# Patient Record
Sex: Female | Born: 1950 | Race: Black or African American | Hispanic: No | Marital: Married | Smoking: Never smoker
Health system: Southern US, Community
[De-identification: ages and names within clinical notes are randomized; demographics above are authoritative.]

---

## 2009-01-16 ENCOUNTER — Ambulatory Visit: Payer: Self-pay | Admitting: Vascular Surgery

## 2010-06-23 NOTE — Consult Note (Signed)
VASCULAR SURGERY CONSULTATION   Jackie Duffy, Jackie Duffy  DOB:  [birth year]                                       01/16/2009  ZOXWR#:[id number]   I saw the patient in the office today in consultation concerning her  pain in her left leg and swelling in the left leg.  This is a pleasant  60 year old woman who approximately 2 years ago first began noticing  some knots in her left leg and was felt to have superficial  thrombophlebitis.  These really had not caused much problem until about  4 months ago when she noted some swelling in the left leg and then over  the last 3 months she has had increasing tenderness along the medial  aspect of the left leg and also she noticed some discoloration and  induration along the medial aspect of the left leg.  She experiences  pain in the leg which is aggravated by standing and relieved somewhat  with elevation.  She also notes that she bought herself some mild  compression stockings at the drug store and she feels that this has  helped somewhat when she is wearing her compression stockings.  She has  had no history of DVT that she is aware of.  She denies any  claudication, rest pain or nonhealing ulcers.   PAST MEDICAL HISTORY:  Fairly unremarkable.  She denies any history of  diabetes, hypertension, hypercholesterolemia, history of previous  myocardial infarction, history of congestive heart failure or history of  COPD.   FAMILY HISTORY:  Father had heart attack at age 38.  She is unaware of  any other history of premature cardiovascular disease.   SOCIAL HISTORY:  She is married.  She has two children.  She does not  use tobacco.  She does not use alcohol on a regular basis.   MEDICATIONS:  Are calcium and lysine OTC.   REVIEW OF SYSTEMS:  NEUROLOGIC:  She has occasional headaches and has a  history of migraine headaches.  She has had no dizziness, blackouts or  seizures.  MUSCULOSKELETAL:  She does have some generalized  arthritis.  She had no  significant muscle pain.  GENERAL, CARDIOVASCULAR, PULMONARY, GI, GU, PSYCHIATRIC, ENT,  HEMATOLOGIC review of systems is unremarkable and is documented on the  medical history form in her chart.   PHYSICAL EXAMINATION:  This is a pleasant 60 year old woman who appears  her stated age.  Her blood pressure is 152/79, heart rate is 87,  temperature is 97.8.  HEENT:  Pupils equal, round, and reactive to light  and accommodation.  Extraocular motions are intact.  Conjunctivae are  normal.  Neck is supple.  There is no JVD.  She does have an enlarged  thyroid.  No other masses are appreciated.  On cardiovascular exam I do  not detect any carotid bruits.  She has a regular rate and rhythm  without murmur or gallop appreciated.  She has minimal swelling in the  left leg with no swelling in the right leg.  She has palpable radial,  femoral, popliteal and posterior tibial pulses bilaterally.  Lungs are  clear bilaterally to auscultation without rales, rhonchi or wheezing.  Abdomen:  Soft and nontender with normal pitched bowel sounds.  No  masses appreciated.  Musculoskeletal exam is no major deformities or  cyanosis.  Neurologic:  She has no focal  weakness or paresthesias.  Lymphatic examination; she has no cervical, axillary or inguinal  lymphadenopathy noted.  Skin:  There are no ulcers or rashes.  She does  have some induration along the medial aspect of the left medial leg with  some hyperpigmentation just above the ankle consistent with chronic  venous insufficiency.   I reviewed her venous Doppler study which was done on December 19, 2008.  This showed no evidence of DVT in the left lower extremity.     Based on physical exam and history, I think she does have evidence of  superficial thrombophlebitis of the left leg.  In addition I think she  has some underlying chronic venous insufficiency.  I have recommended  that she elevate her legs intermittently during  the day and we have  discussed the proper position for this.  I have also written her a  prescription for compression stockings with a fairly mild gradient for  now as I think a tighter stocking would be uncomfortable for her.  I  have written for a gradient with a 15-20 mmHg gradient.  We have also  discussed the importance of keeping skin well lubricated in the winter.  When her symptoms are worsening she knows to try warm compresses and  Aleve or ibuprofen.  She understands this is a chronic problem and that  the mainstay of treatment is elevation and compression therapy.  I have  reassured her that she had excellent arterial flow.  We will be happy to  see her back at any time if any new vascular issues arise.   Di Kindle. Edilia Bo, M.D.  Electronically Signed  CSD/MEDQ  D:  01/16/2009  T:  01/17/2009  Job:  [id number]   cc:   Fara Chute

## 2012-10-23 ENCOUNTER — Ambulatory Visit (INDEPENDENT_AMBULATORY_CARE_PROVIDER_SITE_OTHER): Payer: BC Managed Care – HMO | Admitting: Endocrinology

## 2012-10-23 ENCOUNTER — Encounter: Payer: Self-pay | Admitting: Endocrinology

## 2012-10-23 VITALS — BP 160/82 | HR 80 | Temp 98.5°F | Resp 10 | Ht 68.0 in | Wt 140.4 lb

## 2012-10-23 DIAGNOSIS — E042 Nontoxic multinodular goiter: Secondary | ICD-10-CM

## 2012-10-23 DIAGNOSIS — E0590 Thyrotoxicosis, unspecified without thyrotoxic crisis or storm: Secondary | ICD-10-CM | POA: Insufficient documentation

## 2012-10-23 NOTE — Progress Notes (Signed)
  Reason for Appointment:  Abnormal thyroid functions, followup visit    History of Present Illness:   The  patient had routine lab work done in 2012 and was found to have low TSH.   The patient did not have any symptoms such as:  palpitations, shakiness, nervousness, feeling excessively warm and sweaty, and fatigue. Also did not have any weight loss. She has been followed periodically in still does not have any of these symptoms. Labs on her last visit in 2013 showed TSH 0.06, free T4 0.92 and free T3 of 3.2 I-131 uptake in the past was 10%, detailed report not available   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 Also apparently had a needle biopsy done and reportedly had hyperplasia in the biopsy. Needle aspiration was also repeated subsequently and she believes results were benign. No records of previous thyroid scans or ultrasounds are available  Office Visit on 10/23/2012  Component Date Value Range Status  . T3, Free 10/23/2012 2.8  2.3 - 4.2 pg/mL Final  . Free T4 10/23/2012 0.88  0.60 - 1.60 ng/dL Final  . TSH 16/11/9602 0.06* 0.35 - 5.50 uIU/mL Final      Medication List       This list is accurate as of: 10/23/12 11:59 PM.  Always use your most recent med list.               aspirin 81 MG tablet  Take 81 mg by mouth daily.     calcium gluconate 650 MG tablet  Take 650 mg by mouth daily.     L-Lysine 500 MG Caps  Take 500 mg by mouth.            No past medical history on file.  No past surgical history on file.  No family history on file.  Social History:  reports that she has never smoked. She has never used smokeless tobacco. Her alcohol and drug histories are not on file.  Allergies: No Known Allergies  Review of Systems:  CARDIOLOGY: no history of high blood pressure.       RESPIRATORY:  no dyspnea on exertion.      GASTROENTEROLOGY:  no GI problems     ENDOCRINOLOGY:  no history of  Diabetes.             Examination:   BP 160/82  Pulse 80  Temp(Src) 98.5 F (36.9 C)  Resp 10  Ht 5\' 8"  (1.727 m)  Wt 140 lb 6.4 oz (63.685 kg)  BMI 21.35 kg/m2  SpO2 99%   General Appearance:  well-built and nourished, pleasant, not anxious or hyperkinetic..        Eyes: No excessive prominence or stare. No swelling of the eyelids  Neck: The thyroid is enlarged about 3 times normal on the right side and nodular, left side is enlarged about 2 times and one nodule felt medially, neck circumference is 35 cm . There is no lymphadenopathy .          Neurological: REFLEXES: at biceps are normal.  TREMORS:  none     Assessments    Subclinical hyperthyroidism/autonomous multinodular goiter, unchanged Thyroid levels are consistently about the same since 2012   Treatment:   Annual followup   Larenz Frasier 10/24/2012, 3:51 PM

## 2012-10-24 ENCOUNTER — Telehealth: Payer: Self-pay | Admitting: *Deleted

## 2012-10-24 NOTE — Telephone Encounter (Signed)
Message copied by Hermenia Bers on Tue Oct 24, 2012  3:56 PM ------      Message from: Reather Littler      Created: Tue Oct 24, 2012  3:50 PM       Please let patient know that the thyroid levels are same as last year and no further action needed ------

## 2012-10-24 NOTE — Patient Instructions (Signed)
Labs to be done

## 2012-10-24 NOTE — Telephone Encounter (Signed)
Left message to return call 

## 2012-10-24 NOTE — Progress Notes (Signed)
Quick Note:  Please let patient know that the thyroid levels are same as last year and no further action needed ______

## 2012-11-02 ENCOUNTER — Encounter: Payer: Self-pay | Admitting: *Deleted

## 2012-11-02 ENCOUNTER — Telehealth: Payer: Self-pay | Admitting: *Deleted

## 2012-11-02 NOTE — Telephone Encounter (Signed)
Letter mailed

## 2013-09-14 ENCOUNTER — Ambulatory Visit (INDEPENDENT_AMBULATORY_CARE_PROVIDER_SITE_OTHER): Payer: No Typology Code available for payment source | Admitting: Endocrinology

## 2013-09-14 ENCOUNTER — Encounter: Payer: Self-pay | Admitting: Endocrinology

## 2013-09-14 VITALS — BP 162/83 | HR 94 | Resp 16 | Ht 68.0 in | Wt 145.6 lb

## 2013-09-14 DIAGNOSIS — E0590 Thyrotoxicosis, unspecified without thyrotoxic crisis or storm: Secondary | ICD-10-CM

## 2013-09-14 DIAGNOSIS — E042 Nontoxic multinodular goiter: Secondary | ICD-10-CM

## 2013-09-14 LAB — TSH: TSH: 0 u[IU]/mL — AB (ref 0.35–4.50)

## 2013-09-14 LAB — T4, FREE: Free T4: 1.49 ng/dL (ref 0.60–1.60)

## 2013-09-14 LAB — T3, FREE: T3, Free: 4.5 pg/mL — ABNORMAL HIGH (ref 2.3–4.2)

## 2013-09-14 NOTE — Progress Notes (Signed)
Reason for Appointment:  Abnormal thyroid functions, followup visit    History of Present Illness:   The  patient had routine lab work done in 2012 and was found to have low TSH.   The patient did not have any symptoms such as:  palpitations, shakiness, nervousness, feeling excessively warm and sweaty, and fatigue. Also did not have any weight loss. She has been followed periodically in still does not have any of these symptoms. Labs on her last visit did not show evidence of hyperthyroidism. I-131 uptake in the past was 10%, detailed report not available She thinks her gynecologist had her labs in June and not clear what the results were She is here for further followup   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 Also apparently had a needle biopsy done and reportedly had hyperplasia in the biopsy. Needle aspiration was also repeated subsequently and she believes results were benign. No records of previous thyroid scans or ultrasounds are available Clinically she has had a large goiter, rate her on the right side She has only minimal local pressure symptoms, not new and no difficulty swallowing  No visits with results within 1 Week(s) from this visit. Latest known visit with results is:  Office Visit on 10/23/2012  Component Date Value Ref Range Status  . T3, Free 10/23/2012 2.8  2.3 - 4.2 pg/mL Final  . Free T4 10/23/2012 0.88  0.60 - 1.60 ng/dL Final  . TSH 16/10/960409/15/2014 0.06* 0.35 - 5.50 uIU/mL Final      Medication List       This list is accurate as of: 09/14/13  2:14 PM.  Always use your most recent med list.               calcium gluconate 650 MG tablet  Take 650 mg by mouth daily.     L-Lysine 500 MG Caps  Take 500 mg by mouth.            No past medical history on file.  No past surgical history on file.  No family history on file.  Social History:  reports that she has never smoked. She has  never used smokeless tobacco. Her alcohol and drug histories are not on file.  Allergies:  Allergies  Allergen Reactions  . Azithromycin     Pt states she just didn't like it    Review of Systems:  CARDIOLOGY: no history of high blood pressure.       RESPIRATORY:  no dyspnea on exertion.      GASTROENTEROLOGY:  no change in bowel habits     ENDOCRINOLOGY:  no history of Diabetes.             Examination:   BP 162/83  Pulse 94  Resp 16  Ht 5\' 8"  (1.727 m)  Wt 145 lb 9.6 oz (66.044 kg)  BMI 22.14 kg/m2  SpO2 95%   General Appearance:   pleasant, not anxious or hyperkinetic..        Eyes: No excessive prominence or stare.  Neck: The thyroid is enlarged about 3-3.5 times normal on the right side and nodular; firm lower medial part;, left side is enlarged about 2 times and one 2 cm nodule felt medially, neck circumference is 35 cm .  There is no lymphadenopathy .          Neurological: REFLEXES: at biceps are normal.  TREMORS:  none  Assessments    Subclinical hyperthyroidism/autonomous multinodular goiter, unchanged clinically Again she does not have any significant local pressure symptoms and discussed location of the thyroid and demonstrated on the model how it may affect the local structures  She has a relatively fast heart rate today although asymptomatic  Previously thyroid levels have been consistently about the same since 2012   Treatment:    check thyroid levels today and decide on further management   [facility] 09/14/2013, 2:14 PM    Addendum: Labs show evidence of T3 toxicosis, will need to have I-131 uptake and treatment done also  Office Visit on 09/14/2013  Component Date Value Ref Range Status  . Free T4 09/14/2013 1.49  0.60 - 1.60 ng/dL Final  . TSH 16/11/9602 0.00* 0.35 - 4.50 uIU/mL Final  . T3, Free 09/14/2013 4.5* 2.3 - 4.2 pg/mL Final

## 2013-09-16 NOTE — Progress Notes (Signed)
Quick Note:  Her thyroid is overactive, need to get the I-131 uptake and scan, can do this in [place] or closest hospital, if she wants to do it here we'll need to schedule ______

## 2013-10-19 ENCOUNTER — Telehealth: Payer: Self-pay | Admitting: Endocrinology

## 2013-10-19 NOTE — Telephone Encounter (Signed)
Patient would like to know if her Xrays are available   Please advise   Thank you

## 2013-10-23 NOTE — Telephone Encounter (Signed)
Please see below and advise.

## 2013-10-23 NOTE — Telephone Encounter (Signed)
Patient would like the result of her Ct scan, and stated that her heart beats really fast at the slightest movement.

## 2013-10-24 ENCOUNTER — Other Ambulatory Visit: Payer: Self-pay | Admitting: Endocrinology

## 2013-10-24 ENCOUNTER — Other Ambulatory Visit: Payer: Self-pay | Admitting: *Deleted

## 2013-10-24 DIAGNOSIS — E0520 Thyrotoxicosis with toxic multinodular goiter without thyrotoxic crisis or storm: Secondary | ICD-10-CM

## 2013-10-24 MED ORDER — ATENOLOL 50 MG PO TABS: 50.0000 mg | ORAL_TABLET | Freq: Every day | ORAL | Status: DC

## 2013-10-24 NOTE — Telephone Encounter (Signed)
She does have an overactive thyroid and will order her radioactive iodine treatment. Please notify Corrie Dandy to schedule ASAP. She will see me back in followup 3 weeks after the treatment weeks Meanwhile start taking atenolol 50 mg daily to control heart rate

## 2013-10-24 NOTE — Telephone Encounter (Signed)
Noted, patient is aware, rx sent 

## 2013-11-05 ENCOUNTER — Encounter: Payer: Self-pay | Admitting: Endocrinology

## 2013-11-26 ENCOUNTER — Ambulatory Visit (INDEPENDENT_AMBULATORY_CARE_PROVIDER_SITE_OTHER): Payer: No Typology Code available for payment source | Admitting: Endocrinology

## 2013-11-26 ENCOUNTER — Encounter: Payer: Self-pay | Admitting: Endocrinology

## 2013-11-26 VITALS — BP 164/71 | HR 98 | Temp 97.7°F | Resp 14 | Ht 68.0 in | Wt 144.0 lb

## 2013-11-26 DIAGNOSIS — E0590 Thyrotoxicosis, unspecified without thyrotoxic crisis or storm: Secondary | ICD-10-CM

## 2013-11-26 DIAGNOSIS — Z23 Encounter for immunization: Secondary | ICD-10-CM

## 2013-11-26 LAB — T3, FREE: T3, Free: 4.6 pg/mL — ABNORMAL HIGH (ref 2.3–4.2)

## 2013-11-26 LAB — T4, FREE: Free T4: 2.05 ng/dL — ABNORMAL HIGH (ref 0.60–1.60)

## 2013-11-26 NOTE — Progress Notes (Signed)
Reason for Appointment:  Hyperthyroidism, followup visit    History of Present Illness:     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 Also apparently had a needle biopsy done and reportedly had hyperplasia in the biopsy. Needle aspiration was also repeated subsequently and she believes results were benign. No records of previous thyroid scans or ultrasounds are available Clinically she has had a large goiter, larger on the right side She has had only minimal local pressure symptoms, not new and no difficulty swallowing   Hyperthyroidism: She had routine lab work done in 2012 and was found to have low TSH. She was initially asymptomatic and did not have any overt hyperthyroidism. 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 She was not really complaining of any hyperthyroid symptoms  However after her visit she reported that she was having some palpitations and shakiness She was then started on atenolol 50 mg daily She had her I-131 uptake done in 9/15 locally and the result was high at 40% She was given 30 mCi of I-131 which she received on 10/29/13  Since he started atenolol she has had less palpitations and shakiness. Has not had any change in her initial level and has not fairly good overall She is now here for followup. She does tend to have anxiety with coming into the office   No visits with results within 1 Week(s) from this visit. Latest known visit with results is:  Office Visit on 09/14/2013  Component Date Value Ref Range Status  . Free T4 09/14/2013 1.49  0.60 - 1.60 ng/dL Final  . TSH 40/98/119108/08/2013 0.00* 0.35 - 4.50 uIU/mL Final  . T3, Free 09/14/2013 4.5* 2.3 - 4.2 pg/mL Final      Medication List       This list is  accurate as of: 11/26/13  3:50 PM.  Always use your most recent med list.               atenolol 50 MG tablet  Commonly known as:  TENORMIN  Take 1 tablet (50 mg total) by mouth daily.     calcium gluconate 650 MG tablet  Take 650 mg by mouth daily.     L-Lysine 500 MG Caps  Take 500 mg by mouth.            No past medical history on file.  No past surgical history on file.  No family history on file.  Social History:  reports that she has never smoked. She has never used smokeless tobacco. Her alcohol and drug histories are not on file.  Allergies:  Allergies  Allergen Reactions  . Azithromycin     Pt states she just didn't like it    Review of Systems:  CARDIOLOGY: no  prior history of high blood pressure.       ENDOCRINOLOGY:  no history of Diabetes.           She recently has had herpes zoster on her left upper chest, rash is improving   Examination:   BP 164/71  Pulse 98  Temp(Src) 97.7 F (36.5 C)  Resp 14  Ht 5\' 8"  (1.727 m)  Wt 144 lb (65.318 kg)  BMI 21.90 kg/m2  SpO2 98%  General Appearance:   pleasant, mildly anxious        Eyes: No excessive prominence or stare.  Neck: The thyroid is enlarged about 3-3.5 times normal on the right side and nodular; left side is enlarged about 2 times and a 2 cm nodule felt medially, neck circumference is 35 cm .  There is no lymphadenopathy .          Neurological: REFLEXES: at biceps are normal.  TREMORS:  none     Assessments    She has had mild hyperthyroidism which evolved from her autonomous multinodular goiter. Since her uptake was adequate at 40% she has received I-131 treatment Subjectively she is feeling somewhat better with taking atenolol and not clear if she is improved with her hyperthyroidism Objectively she does look euthyroid again She has a relatively fast heart rate today again  Her goiter is clinically about the same size   Treatment:    Check thyroid levels today and decide on  further management, consider methimazole if she is having higher T3 levels   Georgian Mcclory 11/26/2013, 3:50 PM

## 2013-11-28 ENCOUNTER — Other Ambulatory Visit: Payer: Self-pay | Admitting: *Deleted

## 2013-11-28 MED ORDER — METHIMAZOLE 10 MG PO TABS: ORAL_TABLET | ORAL | Status: DC

## 2014-01-08 ENCOUNTER — Encounter: Payer: Self-pay | Admitting: Endocrinology

## 2014-01-08 ENCOUNTER — Other Ambulatory Visit (INDEPENDENT_AMBULATORY_CARE_PROVIDER_SITE_OTHER): Payer: No Typology Code available for payment source

## 2014-01-08 ENCOUNTER — Ambulatory Visit (INDEPENDENT_AMBULATORY_CARE_PROVIDER_SITE_OTHER): Payer: No Typology Code available for payment source | Admitting: Endocrinology

## 2014-01-08 VITALS — BP 162/82 | HR 109 | Temp 97.8°F | Resp 14 | Ht 68.0 in | Wt 142.4 lb

## 2014-01-08 DIAGNOSIS — E0520 Thyrotoxicosis with toxic multinodular goiter without thyrotoxic crisis or storm: Secondary | ICD-10-CM

## 2014-01-08 DIAGNOSIS — E0590 Thyrotoxicosis, unspecified without thyrotoxic crisis or storm: Secondary | ICD-10-CM

## 2014-01-08 LAB — TSH: TSH: 0 u[IU]/mL — AB (ref 0.35–4.50)

## 2014-01-08 LAB — T4, FREE: FREE T4: 4.72 ng/dL — AB (ref 0.60–1.60)

## 2014-01-08 LAB — T3, FREE: T3, Free: 18.1 pg/mL — ABNORMAL HIGH (ref 2.3–4.2)

## 2014-01-08 NOTE — Progress Notes (Signed)
Jackie FateJanis Duffy 63 y.o.   Reason for Appointment:  Hyperthyroidism, followup visit    History of Present Illness:     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 Also apparently had a needle biopsy done and reportedly had hyperplasia in the biopsy. Needle aspiration was also repeated subsequently and she believes results were benign. No records of previous thyroid scans or ultrasounds are available Clinically she has had a large goiter, larger on the right side She has had only minimal local pressure symptoms, not new and no difficulty swallowing   Hyperthyroidism: She had routine lab work done in 2012 and was found to have low TSH. She was initially asymptomatic and did not have any overt hyperthyroidism. 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 She reported that she was having some palpitations and shakiness She was then started on atenolol 50 mg daily She had her I-131 uptake done in 9/15 locally and the result was high at 40% Her scan had shown some areas of increased activity including a focal area of increased activity in the right upper pole.  She was given 30 mCi of I-131 on 10/29/13  On her follow-up visit in 10/15 she had less palpitations and shakiness but because of her thyroid levels being higher she was given methimazole 10 mg daily for 3 weeks She feels fairly good overall with no palpitations.  She still takes the atenolol Her pulse rate is about 80 at home and usually not over 100 but she gets anxious in the office and pulse is faster Did not complain of any shakiness today No significant weight change  Wt Readings from Last 3 Encounters:  01/08/14 142 lb 6.4 oz (64.592 kg)  11/26/13 144 lb (65.318 kg)  09/14/13 145 lb 9.6  oz (66.044 kg)    No visits with results within 1 Week(s) from this visit. Latest known visit with results is:  Office Visit on 11/26/2013  Component Date Value Ref Range Status  . Free T4 11/26/2013 2.05* 0.60 - 1.60 ng/dL Final  . T3, Free 40/98/119110/19/2015 4.6* 2.3 - 4.2 pg/mL Final      Medication List       This list is accurate as of: 01/08/14 11:06 AM.  Always use your most recent med list.               atenolol 50 MG tablet  Commonly known as:  TENORMIN  Take 1 tablet (50 mg total) by mouth daily.     calcium gluconate 650 MG tablet  Take 650 mg by mouth daily.     cholecalciferol 1000 UNITS tablet  Commonly known as:  VITAMIN D  Take 1,000 Units by mouth daily.     L-Lysine 500 MG Caps  Take 500 mg by mouth.     methimazole 10 MG tablet  Commonly known as:  TAPAZOLE  Take 1 tablet daily by mouth for 21 days            No past medical history on file.  No past surgical history on file.  No family history on file.  Social History:  reports that she has never smoked. She has never used smokeless tobacco. Her alcohol and drug histories are not on file.  Allergies:  Allergies  Allergen Reactions  . Azithromycin     Pt states she just didn't like it    Review of Systems: No symptoms of swelling of her eyes CARDIOLOGY: no  prior history of high blood pressure.       ENDOCRINOLOGY:  no history of Diabetes.            Examination:   BP 162/82 mmHg  Pulse 109  Temp(Src) 97.8 F (36.6 C)  Resp 14  Ht 5\' 8"  (1.727 m)  Wt 142 lb 6.4 oz (64.592 kg)  BMI 21.66 kg/m2  SpO2 97%   General Appearance:   pleasant, mildly anxious        Eyes: No excessive prominence or stare.  Neck: The thyroid is enlarged about 3 times normal on the right side, mostly medially and nodular; left side is enlarged about 2 times and a 2 cm nodule felt medially, neck circumference is 34.5cm .  There is no lymphadenopathy.  She has a prominent left external carotid pulsation  without tenderness .          Neurological: REFLEXES: at biceps are slightly brisk .  TREMORS:  none     Assessments    She has had mild hyperthyroidism which evolved from her autonomous multinodular goiter. Her scan had shown some areas of increased activity including a focal area of increased activity in the right upper pole. Since her uptake was adequate at 40% she has received I-131 treatment Subjectively she is feeling somewhat better with taking atenolol and not clear if she is improved with her hyperthyroidism Objectively she does look euthyroid although her pulse rate is fast, she thinks this is from white coat syndrome Weight is down 2 pounds  Her goiter is clinically slightly smaller especially on the right lateral area   Treatment:    Check thyroid levels today and decide on further management and follow-up   Eveline Sauve 01/08/2014, 11:06 AM   Addendum: Thyroid levels are much higher.  This may be post radioactive iodine release of thyroid hormones since she does not clinically have overt hyperthyroidism.  However will treat her with methimazole until her next visit and recheck in 3 weeks   Appointment on 01/08/2014  Component Date Value Ref Range Status  . TSH 01/08/2014 0.00* 0.35 - 4.50 uIU/mL Final  . Free T4 01/08/2014 4.72* 0.60 - 1.60 ng/dL Final  . T3, Free 16/10/960412/02/2013 18.1* 2.3 - 4.2 pg/mL Final

## 2014-01-08 NOTE — Progress Notes (Signed)
Quick Note:  Please let patient know that the thyroid result is much higher than before and she will need to start back on methimazole. She needs to take 10 mg twice a day and follow-up in the office in 3 weeks  ______

## 2014-01-09 ENCOUNTER — Other Ambulatory Visit: Payer: Self-pay | Admitting: *Deleted

## 2014-01-09 MED ORDER — METHIMAZOLE 10 MG PO TABS: ORAL_TABLET | ORAL | Status: DC

## 2014-01-30 ENCOUNTER — Encounter: Payer: Self-pay | Admitting: Endocrinology

## 2014-01-30 ENCOUNTER — Ambulatory Visit (INDEPENDENT_AMBULATORY_CARE_PROVIDER_SITE_OTHER): Payer: No Typology Code available for payment source | Admitting: Endocrinology

## 2014-01-30 VITALS — BP 164/72 | HR 98 | Temp 98.0°F | Resp 14 | Ht 68.0 in | Wt 135.4 lb

## 2014-01-30 DIAGNOSIS — E0520 Thyrotoxicosis with toxic multinodular goiter without thyrotoxic crisis or storm: Secondary | ICD-10-CM

## 2014-01-30 LAB — T4, FREE: Free T4: 1.47 ng/dL (ref 0.60–1.60)

## 2014-01-30 LAB — T3, FREE: T3, Free: 4.8 pg/mL — ABNORMAL HIGH (ref 2.3–4.2)

## 2014-01-30 NOTE — Progress Notes (Signed)
Quick Note:  Please let patient know that the lab result is better, reduce Methimazole to 1/day for 1 week then stop ______

## 2014-01-30 NOTE — Progress Notes (Signed)
Jackie FateJanis Duffy 63 y.o.   Reason for Appointment:  Hyperthyroidism, followup visit    History of Present Illness:   Hyperthyroidism: She had routine lab work done in 2012 and was found to have low TSH. She was initially asymptomatic and did not have any overt hyperthyroidism. 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 She reported that she was having some palpitations and shakiness She was  started on atenolol 50 mg daily She had her I-131 uptake done in 9/15 locally and the result was high at 40% Her scan had shown some areas of increased activity including a focal area of increased activity in the right upper pole. She was given 30 mCi of I-131 on 10/29/13  On her follow-up visits although she symptomatically she was better her thyroid levels continue to be high and were significantly high on 01/08/14.  She was given methimazole again 10 mg twice a day She does not complain of any palpitations or shakiness now. However has lost weight but she thinks she had a little nausea with trying methimazole initially She thinks her pulse at home has been about 70 recently and continues taking methimazole with her atenolol   Wt Readings from Last 3 Encounters:  01/30/14 135 lb 6.4 oz (61.417 kg)  01/08/14 142 lb 6.4 oz (64.592 kg)  11/26/13 144 lb (65.318 kg)    No visits with results within 1 Week(s) from this visit. Latest known visit with results is:  Appointment on 01/08/2014  Component Date Value Ref Range Status  . TSH 01/08/2014 0.00* 0.35 - 4.50 uIU/mL Final  . Free T4 01/08/2014 4.72* 0.60 - 1.60 ng/dL Final  . T3, Free 16/10/960412/02/2013 18.1* 2.3 - 4.2 pg/mL Final     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 Also apparently had a needle biopsy done and reportedly had hyperplasia in  the biopsy. Needle aspiration was also repeated subsequently and she believes results were benign. No records of previous thyroid scans or ultrasounds are available  The patient brought a photograph of her goiter before her I-131 treatment and recently and appears to have a smaller goiter Also she thinks she has less pressure sensation in her neck       Medication List       This list is accurate as of: 01/30/14  3:13 PM.  Always use your most recent med list.               atenolol 50 MG tablet  Commonly known as:  TENORMIN  Take 1 tablet (50 mg total) by mouth daily.     calcium gluconate 650 MG tablet  Take 650 mg by mouth daily.     cholecalciferol 1000 UNITS tablet  Commonly known as:  VITAMIN D  Take 1,000 Units by mouth daily.     L-Lysine 500 MG Caps  Take 500 mg by mouth.     methimazole 10 MG tablet  Commonly known as:  TAPAZOLE  Take 1 tablet twice a day            No past medical history on file.  No past surgical history on file.  No family history on file.  Social History:  reports that she has never smoked. She has never used smokeless tobacco. Her alcohol and drug  histories are not on file.  Allergies:  Allergies  Allergen Reactions  . Azithromycin     Pt states she just didn't like it    Review of Systems:  She had nausea with methimazole but recently appetite better CARDIOLOGY: no  prior history of high blood pressure.       ENDOCRINOLOGY:  no history of Diabetes.            Examination:   BP 164/72 mmHg  Pulse 98  Temp(Src) 98 F (36.7 C)  Resp 14  Ht 5\' 8"  (1.727 m)  Wt 135 lb 6.4 oz (61.417 kg)  BMI 20.59 kg/m2  SpO2 99%   General Appearance:   pleasant, mildly anxious        Eyes: No stare or lid retraction Neck: The thyroid is enlarged about 3 times normal on the right side and nodular; left side is enlarged about 2 times and firm, neck circumference is 34cm . Neurological: REFLEXES: at biceps are normal .  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 Since her I-131 treatment in 9/15 she has had exacerbation of her hyperthyroidism with increased thyroid levels Currently is on methimazole and subjectively doing better Also her pulses better controlled at least at home She also thinks her goiter is smaller and is causing less local pressure symptoms; on exam her neck circumference is smaller than before  Although she has lost weight she is clinically looking euthyroid, tends to have anxiety and increased pulse and blood pressure in the office She is still taking methimazole 10 mg twice a day    Treatment:    Check thyroid levels today and decide on further management    Salina Stanfield 01/30/2014, 3:13 PM   Addendum: Thyroid levels are much better and can reduce methimazole to 10 mg daily for 1 week and then stop

## 2014-02-04 ENCOUNTER — Other Ambulatory Visit: Payer: Self-pay | Admitting: Endocrinology

## 2014-02-12 ENCOUNTER — Other Ambulatory Visit: Payer: Self-pay | Admitting: Endocrinology

## 2014-02-20 ENCOUNTER — Ambulatory Visit: Payer: No Typology Code available for payment source | Admitting: Endocrinology

## 2014-02-27 ENCOUNTER — Ambulatory Visit: Payer: No Typology Code available for payment source | Admitting: Endocrinology

## 2014-03-06 ENCOUNTER — Ambulatory Visit (INDEPENDENT_AMBULATORY_CARE_PROVIDER_SITE_OTHER): Payer: BLUE CROSS/BLUE SHIELD | Admitting: Endocrinology

## 2014-03-06 ENCOUNTER — Encounter: Payer: Self-pay | Admitting: Endocrinology

## 2014-03-06 VITALS — BP 160/81 | HR 136 | Temp 98.6°F | Resp 14 | Ht 68.0 in | Wt 130.2 lb

## 2014-03-06 DIAGNOSIS — E0520 Thyrotoxicosis with toxic multinodular goiter without thyrotoxic crisis or storm: Secondary | ICD-10-CM

## 2014-03-06 LAB — T3, FREE: T3, Free: 22.5 pg/mL — ABNORMAL HIGH (ref 2.3–4.2)

## 2014-03-06 LAB — T4, FREE: FREE T4: 5.31 ng/dL — AB (ref 0.60–1.60)

## 2014-03-06 LAB — TSH: TSH: 0.02 u[IU]/mL — AB (ref 0.35–4.50)

## 2014-03-06 NOTE — Progress Notes (Signed)
Quick Note:  Please let patient know that the lab result is very high again and will need to schedule repeat radioactive iodine treatment in about a month.  Meanwhile start on methimazole 20 mg twice a day until a week before the radio active iodine uptake test. [place] do the [place] and [place] in [place] this time  ______

## 2014-03-06 NOTE — Addendum Note (Signed)
Addended by: Reather LittlerKUMAR, Enrica Corliss on: 03/06/2014 02:55 PM   Modules accepted: Orders

## 2014-03-06 NOTE — Progress Notes (Signed)
Jackie Duffy 64 y.o.   Reason for Appointment:  Hyperthyroidism, followup visit    History of Present Illness:   Hyperthyroidism: She had routine lab work done in 2012 and was found to have low TSH. She was initially asymptomatic and did not have any overt hyperthyroidism. 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 She reported that she was having some palpitations and shakiness She was  started on atenolol 50 mg daily She had her I-131 uptake done in 9/15 locally and the result was high at 40% Her scan had shown some areas of increased activity including a focal area of increased activity in the right upper pole.  She was given 30 mCi of I-131 on 10/29/13  On her follow-up visits although she symptomatically she was better her thyroid levels continued to be high and markedly increased on 01/08/14.   She was given methimazole again 10 mg twice a day With methimazole her symptoms of palpitations and shakiness improved but she continues to lose weight. Her levels had come down significantly and 12/15 and her methimazole was tapered off Her goiter had improved in size with the I-131 treatment but still significantly large on follow-up  She does think that she has some increase in pulse rate up to 90 recently She thinks she has not had any shakiness or anxiety but has lost weight from stress recently She still takes atenolol    Wt Readings from Last 3 Encounters:  03/06/14 130 lb 3.2 oz (59.058 kg)  01/30/14 135 lb 6.4 oz (61.417 kg)  01/08/14 142 lb 6.4 oz (64.592 kg)    No visits with results within 1 Week(s) from this visit. Latest known visit with results is:  Office Visit on 01/30/2014  Component Date Value Ref Range Status  . Free T4 01/30/2014 1.47  0.60 - 1.60 ng/dL Final  . 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 Also apparently had a needle biopsy done and reportedly had hyperplasia in the biopsy. Needle aspiration was also repeated subsequently and she believes results were benign. No records of previous thyroid scans or ultrasounds are available  The patient brought a photograph of her goiter before her I-131 treatment and recently and appears to have a smaller goiter Also she thinks she has less pressure sensation in her neck       Medication List       This list is accurate as of: 03/06/14 10:55 AM.  Always use your most recent med list.               atenolol 50 MG tablet  Commonly known as:  TENORMIN  TAKE ONE TABLET BY MOUTH ONCE EVERY DAY     cholecalciferol 1000 UNITS tablet  Commonly known as:  VITAMIN D  Take 1,000 Units by mouth daily.     Vitamin D3 1000 UNITS Caps  Take by mouth.     L-Lysine 500 MG Caps  Take 500 mg by mouth.     methimazole 10 MG tablet  Commonly known as:  TAPAZOLE  Take 1 tablet twice a day            No past medical history on file.  No past surgical history  on file.  No family history on file.  Social History:  reports that she has never smoked. She has never used smokeless tobacco. Her alcohol and drug histories are not on file.  Allergies:  Allergies  Allergen Reactions  . Azithromycin     Pt states she just didn't like it    Review of Systems:  CARDIOLOGY: no  prior history of high blood pressure.       ENDOCRINOLOGY:  no history of Diabetes.            Examination:   BP 160/81 mmHg  Pulse 136  Temp(Src) 98.6 F (37 C)  Resp 14  Ht 5\' 8"  (1.727 m)  Wt 130 lb 3.2 oz (59.058 kg)  BMI 19.80 kg/m2  SpO2 96%   General Appearance:   pleasant, somewhat anxious        Eyes: No stare or swelling of the eyelids Neck: The thyroid is enlarged about 3 times normal on the right side and nodular; left side is enlarged about  2 times normal, smooth air and firm.  The neck circumference is 34cm . Repeat pulse 108  Neurological: REFLEXES: at biceps are brisk.  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 Since her I-131 treatment in 9/15 she has had exacerbation of her hyperthyroidism with increased thyroid levels She has been treated with methimazole with improvement in her levels and currently has not been taking any for the last month  However clinically she still appears to be somewhat hyperthyroid and has lost weight Her goiter appears to be the same in size recently   Plan:    Check thyroid levels today and decide on further management  May need to consider repeat I-131 treatment   Rahkeem Senft 03/06/2014, 10:55 AM

## 2014-03-07 ENCOUNTER — Encounter (INDEPENDENT_AMBULATORY_CARE_PROVIDER_SITE_OTHER): Payer: Self-pay | Admitting: *Deleted

## 2014-03-07 ENCOUNTER — Other Ambulatory Visit: Payer: Self-pay | Admitting: *Deleted

## 2014-03-07 ENCOUNTER — Encounter (INDEPENDENT_AMBULATORY_CARE_PROVIDER_SITE_OTHER): Payer: Self-pay

## 2014-03-07 MED ORDER — METHIMAZOLE 10 MG PO TABS: ORAL_TABLET | ORAL | Status: DC

## 2014-03-19 ENCOUNTER — Telehealth: Payer: Self-pay | Admitting: Endocrinology

## 2014-03-19 NOTE — Telephone Encounter (Signed)
Pt needs call back regarding the metformin and the uptake scan

## 2014-03-21 ENCOUNTER — Ambulatory Visit (HOSPITAL_COMMUNITY): Payer: No Typology Code available for payment source

## 2014-03-22 ENCOUNTER — Encounter (HOSPITAL_COMMUNITY): Payer: No Typology Code available for payment source

## 2014-04-01 ENCOUNTER — Encounter (HOSPITAL_COMMUNITY): Payer: Self-pay

## 2014-04-01 ENCOUNTER — Ambulatory Visit (HOSPITAL_COMMUNITY)
Admission: RE | Admit: 2014-04-01 | Discharge: 2014-04-01 | Disposition: A | Discharge status: Home / Self Care | Payer: BLUE CROSS/BLUE SHIELD | Source: Ambulatory Visit | Attending: Endocrinology | Admitting: Endocrinology

## 2014-04-01 DIAGNOSIS — E0520 Thyrotoxicosis with toxic multinodular goiter without thyrotoxic crisis or storm: Secondary | ICD-10-CM | POA: Diagnosis present

## 2014-04-01 MED ORDER — SODIUM IODIDE I 131 CAPSULE
10.0000 | Freq: Once | INTRAVENOUS | Status: AC | PRN
  Administered 2014-04-01: 10 via ORAL

## 2014-04-02 ENCOUNTER — Encounter (HOSPITAL_COMMUNITY): Payer: Self-pay

## 2014-04-02 ENCOUNTER — Ambulatory Visit (HOSPITAL_COMMUNITY)
Admission: RE | Admit: 2014-04-02 | Discharge: 2014-04-02 | Disposition: A | Discharge status: Home / Self Care | Payer: BLUE CROSS/BLUE SHIELD | Source: Ambulatory Visit | Attending: Endocrinology | Admitting: Endocrinology

## 2014-04-02 MED ORDER — SODIUM PERTECHNETATE TC 99M INJECTION
10.0000 | Freq: Once | INTRAVENOUS | Status: AC | PRN
  Administered 2014-04-02: 10 via INTRAVENOUS

## 2014-04-03 ENCOUNTER — Other Ambulatory Visit: Payer: Self-pay | Admitting: Endocrinology

## 2014-04-03 DIAGNOSIS — E0520 Thyrotoxicosis with toxic multinodular goiter without thyrotoxic crisis or storm: Secondary | ICD-10-CM

## 2014-04-05 ENCOUNTER — Telehealth: Payer: Self-pay

## 2014-04-05 NOTE — Telephone Encounter (Signed)
Pt scheduled for 04/12/2014.

## 2014-04-05 NOTE — Telephone Encounter (Signed)
I-131 treatment needs to be done now

## 2014-04-05 NOTE — Telephone Encounter (Signed)
Trying to get  I31 scheduled and in the referral note it states the test should be done in September. Wanted to double check on this and see if this should be scheduled now. Please advise, Thanks!

## 2014-04-10 ENCOUNTER — Ambulatory Visit: Payer: No Typology Code available for payment source | Admitting: Endocrinology

## 2014-04-12 ENCOUNTER — Encounter (HOSPITAL_COMMUNITY)
Admission: RE | Admit: 2014-04-12 | Discharge: 2014-04-12 | Disposition: A | Discharge status: Home / Self Care | Payer: BLUE CROSS/BLUE SHIELD | Source: Ambulatory Visit | Attending: Endocrinology | Admitting: Endocrinology

## 2014-04-12 DIAGNOSIS — E0520 Thyrotoxicosis with toxic multinodular goiter without thyrotoxic crisis or storm: Secondary | ICD-10-CM | POA: Insufficient documentation

## 2014-04-12 MED ORDER — SODIUM IODIDE I 131 CAPSULE
30.0000 | Freq: Once | INTRAVENOUS | Status: AC | PRN
  Administered 2014-04-12: 31.6 via ORAL

## 2014-05-10 ENCOUNTER — Ambulatory Visit (INDEPENDENT_AMBULATORY_CARE_PROVIDER_SITE_OTHER): Payer: BLUE CROSS/BLUE SHIELD | Admitting: Endocrinology

## 2014-05-10 ENCOUNTER — Encounter: Payer: Self-pay | Admitting: Endocrinology

## 2014-05-10 ENCOUNTER — Other Ambulatory Visit: Payer: Self-pay | Admitting: Endocrinology

## 2014-05-10 VITALS — BP 178/86 | HR 101 | Temp 97.6°F | Resp 14 | Ht 68.0 in | Wt 131.8 lb

## 2014-05-10 DIAGNOSIS — E0520 Thyrotoxicosis with toxic multinodular goiter without thyrotoxic crisis or storm: Secondary | ICD-10-CM | POA: Diagnosis not present

## 2014-05-10 DIAGNOSIS — E0590 Thyrotoxicosis, unspecified without thyrotoxic crisis or storm: Secondary | ICD-10-CM | POA: Diagnosis not present

## 2014-05-10 LAB — T4, FREE: Free T4: 1.1 ng/dL (ref 0.60–1.60)

## 2014-05-10 LAB — T3, FREE: T3, Free: 4 pg/mL (ref 2.3–4.2)

## 2014-05-10 NOTE — Progress Notes (Signed)
Jackie Duffy 64 y.o.   Reason for Appointment:  Hyperthyroidism, followup visit    History of Present Illness:   Hyperthyroidism: She had routine lab work done in 2012 and was found to have low TSH. She was initially asymptomatic and did not have any overt hyperthyroidism. 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 She reported that she was having some palpitations and shakiness She was  started on atenolol 50 mg daily She had her I-131 uptake done in 9/15 locally and the result was high at 40% Her scan had shown some areas of increased activity including a focal area of increased activity in the right upper pole.  She was given 30 mCi of I-131 on 10/29/13 and again in 04/2014 Her goiter had improved in size with the first I-131 treatment but was still significantly large on follow-up  On her follow-up visits although she symptomatically she was better her thyroid levels continued to be high and markedly increased on 01/08/14.  She was referred for I-131 treatment because of persistent hyperthyroidism and worsening of the condition and markedly increased levels Also was having some weight loss and continued palpitations  She was given methimazole  10 mg 1 qd after the radioactive iodine treatment recently  With methimazole her symptoms of palpitations and shakiness have resolved She has gained back 1 pound and is feeling fairly good overall She does think that she has a normal pulse around 70 with continuing her atenolol BP 140 at the drug store She thinks she has not had any shakiness or anxiety  Thyroid levels to be checked today   Wt Readings from Last 3 Encounters:  05/10/14 131 lb 12.8 oz (59.784 kg)  03/06/14 130 lb 3.2 oz (59.058 kg)  01/30/14 135 lb 6.4 oz (61.417 kg)    No visits with results within 1 Week(s) from this  visit. Latest known visit with results is:  Office Visit on 03/06/2014  Component Date Value Ref Range Status  . TSH 03/06/2014 0.02* 0.35 - 4.50 uIU/mL Final  . Free T4 03/06/2014 5.31* 0.60 - 1.60 ng/dL Final  . T3, Free 16/10/960401/27/2016 22.5* 2.3 - 4.2 pg/mL Final     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 Also apparently had a needle biopsy done and reportedly had hyperplasia in the biopsy. Needle aspiration was also repeated subsequently and she believes results were benign. No records of previous thyroid scans or ultrasounds are available  The patient brought a photograph of her goiter before her I-131 treatment and recently and appears to have a smaller goiter Also she thinks she has less pressure sensation in her neck       Medication List       This list is accurate as of: 05/10/14  3:12 PM.  Always use your most recent med list.               atenolol 50 MG tablet  Commonly known as:  TENORMIN  TAKE ONE TABLET BY MOUTH ONCE EVERY DAY     calcium carbonate 1250 (500 CA) MG tablet  Commonly known as:  OS-CAL - dosed in mg of elemental calcium  Take 1 tablet by mouth.     L-Lysine 500 MG Caps  Take 500 mg by mouth.  methimazole 10 MG tablet  Commonly known as:  TAPAZOLE  Take 1 tablet four times a day            No past medical history on file.  No past surgical history on file.  No family history on file.  Social History:  reports that she has never smoked. She has never used smokeless tobacco. Her alcohol and drug histories are not on file.  Allergies:  Allergies  Allergen Reactions  . Azithromycin     Pt states she just didn't like it    Review of Systems:  no  prior history of high blood pressure.       ENDOCRINOLOGY:  no history of Diabetes.            Examination:   BP 178/86 mmHg  Pulse 101  Temp(Src) 97.6 F (36.4 C)  Resp 14  Ht  (1.727 m)  Wt 131 lb 12.8 oz  (59.784 kg)  BMI 20.04 kg/m2  SpO2 98%   General Appearance:  not hyperkinetic  Eyes normal external appearance and no swelling of the eyelids Neck: The thyroid is enlarged about 2-2.5 times normal on the right side and nodular; left side is enlarged about 2 times normal, smooth air and firm.   The neck circumference is 33-33.5cm . Neurological: REFLEXES: at biceps are brisk.  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 Since her I-131 treatment in 9/15 she  had exacerbation of her hyperthyroidism with increased thyroid levels and required repeat I-131 treatment in 3/16 She has been treated with methimazole 10 mg subsequently with improvement in her symptoms Also appears to have smaller goiter now compared to her last visit Also her pulse rate at home is fairly good She continues to have anxiety when coming into the office with higher pulse and blood pressure readings compared to normal readings at the drug store   Plan:    Check thyroid levels today and decide on further management  Discussed needing to taper off her medications when thyroid levels are normal  Jackie Duffy 05/10/2014, 3:12 PM    Labs as follows:  Office Visit on 05/10/2014  Component Date Value Ref Range Status  . Free T4 05/10/2014 1.10  0.60 - 1.60 ng/dL Final  . T3, Free 04/54/0981 4.0  2.3 - 4.2 pg/mL Final   She will taper off her atenolol in 1 week and reduce methimazole to half tablet daily for 2 weeks and then stop

## 2014-05-10 NOTE — Patient Instructions (Signed)
Atenolol 1/2 daily

## 2014-05-11 NOTE — Progress Notes (Signed)
Quick Note:  Please let patient know that the lab result is normal She will reduce methimazole to half tablet daily for 2 weeks and then stop completely. Also can stop but atenolol after one week   ______

## 2014-06-14 ENCOUNTER — Other Ambulatory Visit (INDEPENDENT_AMBULATORY_CARE_PROVIDER_SITE_OTHER): Payer: BLUE CROSS/BLUE SHIELD

## 2014-06-14 DIAGNOSIS — E0590 Thyrotoxicosis, unspecified without thyrotoxic crisis or storm: Secondary | ICD-10-CM

## 2014-06-14 LAB — T4, FREE: Free T4: 0.6 ng/dL (ref 0.60–1.60)

## 2014-06-14 LAB — TSH: TSH: 0.03 u[IU]/mL — AB (ref 0.35–4.50)

## 2014-06-19 ENCOUNTER — Encounter: Payer: Self-pay | Admitting: Endocrinology

## 2014-06-19 ENCOUNTER — Ambulatory Visit (INDEPENDENT_AMBULATORY_CARE_PROVIDER_SITE_OTHER): Payer: BLUE CROSS/BLUE SHIELD | Admitting: Endocrinology

## 2014-06-19 VITALS — BP 172/90 | HR 94 | Temp 97.9°F | Resp 14 | Ht 68.0 in | Wt 134.8 lb

## 2014-06-19 DIAGNOSIS — E0520 Thyrotoxicosis with toxic multinodular goiter without thyrotoxic crisis or storm: Secondary | ICD-10-CM | POA: Diagnosis not present

## 2014-06-19 MED ORDER — LEVOTHYROXINE SODIUM 25 MCG PO TABS: 25.0000 ug | ORAL_TABLET | Freq: Every day | ORAL | Status: DC

## 2014-06-19 NOTE — Progress Notes (Signed)
Jackie FateJanis Duffy 64 y.o. [id number]   Reason for Appointment:  Hyperthyroidism, followup visit    History of Present Illness:   Hyperthyroidism: She had routine lab work done in 2012 and was found to have low TSH. She was initially asymptomatic and did not have any overt hyperthyroidism. 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 She reported that she was having some palpitations and shakiness She had her I-131 uptake done in 9/15 locally and the result was high at 40% Her scan had shown some areas of increased activity including a focal area of increased activity in the right upper pole. She was given 30 mCi of I-131 on 10/29/13 and again in 04/2014 Her goiter had improved in size with the first I-131 treatment but was still significantly large on follow-up  On her follow-up visits although she symptomatically she was better her thyroid levels continued to be high and markedly increased on 01/08/14.  She was referred for I-131 treatment because of persistent hyperthyroidism and worsening of the condition and markedly increased levels She was given methimazole subsequently and this was stopped in mid April  RECENT history: Recently symptoms of palpitations and shakiness have resolved She has gained back 3 pound and is feeling fairly good overall, is now able to be more active No heat or cold intolerance, has occasional hot flashes She thinks she has not had any shakiness or anxiety  Thyroid levels now show low normal free T4   Wt Readings from Last 3 Encounters:  06/19/14 134 lb 12.8 oz (61.145 kg)  05/10/14 131 lb 12.8 oz (59.784 kg)  03/06/14 130 lb 3.2 oz (59.058 kg)    Lab Results  Component Value Date   TSH 0.03* 06/14/2014   TSH 0.02* 03/06/2014   TSH 0.00* 01/08/2014   FREET4 0.60 06/14/2014   FREET4 1.10 05/10/2014   FREET4 5.31* 03/06/2014      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 Also apparently had a needle biopsy done and reportedly had hyperplasia in the biopsy. Needle aspiration was also repeated subsequently and she believes results were benign. No records of previous thyroid scans or ultrasounds are available        Medication List       This list is accurate as of: 06/19/14  1:39 PM.  Always use your most recent med list.               atenolol 50 MG tablet  Commonly known as:  TENORMIN  TAKE ONE TABLET BY MOUTH ONCE EVERY DAY     calcium carbonate 1250 (500 CA) MG tablet  Commonly known as:  OS-CAL - dosed in mg of elemental calcium  Take 1 tablet by mouth.     L-Lysine 500 MG Caps  Take 500 mg by mouth.     methimazole 10 MG tablet  Commonly known as:  TAPAZOLE  Take 1 tablet four times a day            No past medical history on file.  No past surgical history on file.  Family History  Problem Relation Age of Onset  . Hypertension Mother   . Hyperparathyroidism Mother   . Hypertension Brother   . Thyroid disease Neg Hx     Social History:  reports  that she has never smoked. She has never used smokeless tobacco. Her alcohol and drug histories are not on file.  Allergies:  Allergies  Allergen Reactions  . Azithromycin     Pt states she just didn't like it    Review of Systems:  no  prior history of high blood pressure.       ENDOCRINOLOGY:  no history of Diabetes.            Examination:   BP 172/90 mmHg  Pulse 94  Temp(Src) 97.9 F (36.6 C)  Resp 14  Ht 5\' 8"  (1.727 m)  Wt 134 lb 12.8 oz (61.145 kg)  BMI 20.50 kg/m2  SpO2 98%   General Appearance:  not anxious, looks well, no puffiness of the eyes  Eyes normal external appearance and no swelling of the eyelids Neck: The thyroid is enlarged about 2-2.5 times normal on the right side and nodular; left side is enlarged about 2  times normal, smooth and firm.  Nodules are firm bilaterally  The neck circumference is 33cm . 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 Since her second I-131 treatment in 04/2014 she has had improvement in her thyroid levels and her methimazole was tapered off  Subjectively she feels the best since she had her treatment initiated Weight is coming up and her symptoms are controlled  Her free T4 is now low normal with stopping methimazole about 3 weeks ago Also her pulse rate at home is fairly good She continues to have anxiety when coming into the office with higher pulse and blood pressure readings compared to normal readings at the drug store   Plan:    Since she may be getting hypothyroid will start her on 25 g of levothyroxine Will check her again in about 6 weeks and decide on further treatment However she will call if she has any symptoms suggestive of hypothyroidism before that, patient handout given  [facility] Nussa[facility]UM[facility],Artyom Stencel 06/19/2014, 1:39 PM

## 2014-06-19 NOTE — Patient Instructions (Signed)
Synthroid 25ug in am

## 2014-07-01 ENCOUNTER — Telehealth: Payer: Self-pay | Admitting: Endocrinology

## 2014-07-01 NOTE — Telephone Encounter (Signed)
Pt called and wanted us to know if there was anything that she needed to do since she missed one pill yesterday morning.

## 2014-07-01 NOTE — Telephone Encounter (Signed)
LVM for pt to call back as soon as possible.   

## 2014-07-01 NOTE — Telephone Encounter (Signed)
Patient would like for you to call her °

## 2014-08-07 ENCOUNTER — Ambulatory Visit: Payer: BLUE CROSS/BLUE SHIELD | Admitting: Endocrinology

## 2014-09-09 ENCOUNTER — Ambulatory Visit (INDEPENDENT_AMBULATORY_CARE_PROVIDER_SITE_OTHER): Payer: Self-pay | Admitting: Endocrinology

## 2014-09-09 ENCOUNTER — Encounter: Payer: Self-pay | Admitting: Endocrinology

## 2014-09-09 VITALS — BP 138/88 | HR 94 | Temp 97.8°F | Resp 14 | Ht 68.0 in | Wt 141.0 lb

## 2014-09-09 DIAGNOSIS — E042 Nontoxic multinodular goiter: Secondary | ICD-10-CM

## 2014-09-09 DIAGNOSIS — E0520 Thyrotoxicosis with toxic multinodular goiter without thyrotoxic crisis or storm: Secondary | ICD-10-CM

## 2014-09-09 LAB — T4, FREE: Free T4: 0.9 ng/dL (ref 0.60–1.60)

## 2014-09-09 LAB — TSH: TSH: 0.03 u[IU]/mL — AB (ref 0.35–4.50)

## 2014-09-09 NOTE — Progress Notes (Signed)
Jackie Duffy 64 y.o. [id number]   Reason for Appointment:  Follow-up of thyroid    History of Present Illness:   Hyperthyroidism: She had routine lab work done in 2012 and was found to have low TSH. She was initially asymptomatic and did not have any overt hyperthyroidism. 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 She reported that she was having some palpitations and shakiness She had her I-131 uptake done in 9/15 locally and the result was high at 40% Her scan had shown some areas of increased activity including a focal area of increased activity in the right upper pole. She was given 30 mCi of I-131 on 10/29/13 and again in 04/2014 Her goiter had improved in size with the first I-131 treatment but was still significantly large on follow-up  On her follow-up visits although she symptomatically she was better her thyroid levels continued to be high and markedly increased on 01/08/14.  She was referred for I-131 treatment because of persistent hyperthyroidism and worsening of the condition and markedly increased levels She was given methimazole subsequently and this was stopped in mid April  RECENT history: Since her I-131 treatment in 3/16 she has felt subjectively fairly good and had normal energy level in 5/16 However since her free T4 level was low normal at 0.6 she was empirically started on 0.025 mg of levothyroxine She does not feel any different and has no unusual fatigue, cold sensitivity or skin changes She does have some thinning of the hair She is taking her levothyroxine in the morning before breakfast   Wt Readings from Last 3 Encounters:  09/09/14 141 lb (63.957 kg)  06/19/14 134 lb 12.8 oz (61.145 kg)  05/10/14 131 lb 12.8 oz (59.784 kg)    Lab Results  Component Value Date   TSH 0.03* 06/14/2014   TSH 0.02*  03/06/2014   TSH 0.00* 01/08/2014   FREET4 0.60 06/14/2014   FREET4 1.10 05/10/2014   FREET4 5.31* 03/06/2014      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 Also apparently had a needle biopsy done and reportedly had hyperplasia in the biopsy. Needle aspiration was also repeated subsequently and she believes results were benign. No records of previous thyroid scans or ultrasounds are available She does occasionally feel a little pressure in her neck but not difficulty swallowing        Medication List       This list is accurate as of: 09/09/14  4:32 PM.  Always use your most recent med list.               calcium carbonate 1250 (500 CA) MG tablet  Commonly known as:  OS-CAL - dosed in mg of elemental calcium  Take 1 tablet by mouth.     L-Lysine 500 MG Caps  Take 500 mg by mouth.     levothyroxine 25 MCG tablet  Commonly known as:  LEVOTHROID  Take 1 tablet (25 mcg total) by mouth daily before breakfast.            No past medical history on file.  No past surgical history on file.  Family History  Problem Relation Age of Onset  . Hypertension Mother   . Hyperparathyroidism Mother   . Hypertension Brother   .  Thyroid disease Neg Hx     Social History:  reports that she has never smoked. She has never used smokeless tobacco. Her alcohol and drug histories are not on file.  Allergies:  Allergies  Allergen Reactions  . Azithromycin     Pt states she just didn't like it    Review of Systems:  No documented history of high blood pressure. she tends to have white coat syndrome        ENDOCRINOLOGY:  no history of Diabetes.            Examination:   BP 138/88 mmHg  Pulse 94  Temp(Src) 97.8 F (36.6 C)  Resp 14  Ht  (1.727 m)  Wt 141 lb (63.957 kg)  BMI 21.44 kg/m2  SpO2 98%   General Appearance: looks well, no puffiness of the eyes Neck: The thyroid is enlarged about 2-2.5 times  normal on the right side and nodular; left side shows about a 2 cm nodule, firm   Nodules are firm bilaterally  The neck circumference is 33.5cm . Neurological: REFLEXES: at biceps are normal.  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 Since her second I-131 treatment in 04/2014 she has had resolution of her hyperthyroidism and in 5/16 free T4 was low normal  Subjectively she feels very well and clinically looks euthyroid Currently taking 25 g of levothyroxine  GOITER: Stable since her last visit, she does have a prior multinodular goiter which overall is smaller with I-131 treatment   Plan:    Will check her thyroid levels today and decide on further management and follow-up  Stanislav Gervase 09/09/2014, 4:32 PM

## 2014-09-10 ENCOUNTER — Other Ambulatory Visit (INDEPENDENT_AMBULATORY_CARE_PROVIDER_SITE_OTHER): Payer: BLUE CROSS/BLUE SHIELD

## 2014-09-10 DIAGNOSIS — E0590 Thyrotoxicosis, unspecified without thyrotoxic crisis or storm: Secondary | ICD-10-CM

## 2014-09-10 LAB — T3, FREE: T3, Free: 3.5 pg/mL (ref 2.3–4.2)

## 2014-09-10 NOTE — Progress Notes (Signed)
Quick Note:  Please let patient know that the thyroid result is normal and no change needed, follow up in 4 months, same day lab ______

## 2014-10-12 ENCOUNTER — Other Ambulatory Visit: Payer: Self-pay | Admitting: Endocrinology

## 2014-12-13 ENCOUNTER — Other Ambulatory Visit: Payer: Self-pay | Admitting: *Deleted

## 2014-12-13 ENCOUNTER — Encounter: Payer: Self-pay | Admitting: Endocrinology

## 2014-12-13 MED ORDER — LEVOTHYROXINE SODIUM 25 MCG PO TABS: ORAL_TABLET | ORAL | Status: DC

## 2015-01-24 ENCOUNTER — Encounter: Payer: Self-pay | Admitting: Endocrinology

## 2015-01-24 ENCOUNTER — Ambulatory Visit (INDEPENDENT_AMBULATORY_CARE_PROVIDER_SITE_OTHER): Payer: Self-pay | Admitting: Endocrinology

## 2015-01-24 VITALS — BP 146/92 | HR 97 | Temp 98.5°F | Resp 14 | Ht 68.0 in | Wt 147.2 lb

## 2015-01-24 DIAGNOSIS — E042 Nontoxic multinodular goiter: Secondary | ICD-10-CM

## 2015-01-24 LAB — TSH: TSH: 3.37 u[IU]/mL (ref 0.35–4.50)

## 2015-01-24 LAB — T4, FREE: FREE T4: 0.71 ng/dL (ref 0.60–1.60)

## 2015-01-24 LAB — T3, FREE: T3, Free: 3.6 pg/mL (ref 2.3–4.2)

## 2015-01-24 NOTE — Progress Notes (Signed)
Jackie Duffy 64 y.o. [id number]   Reason for Appointment:  Follow-up of thyroid    History of Present Illness:   Hyperthyroidism: She had routine lab work done in 2012 and was found to have low TSH. She was initially asymptomatic and did not have any overt hyperthyroidism. 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 She reported that she was having some palpitations and shakiness She had her I-131 uptake done in 9/15 locally and the result was high at 40% Her scan had shown some areas of increased activity including a focal area of increased activity in the right upper pole. She was given 30 mCi of I-131 on 10/29/13 and again in 04/2014 Her goiter had improved in size with the first I-131 treatment but was still significantly large on follow-up  On her follow-up visits although she symptomatically she was better her thyroid levels continued to be high and markedly increased on 01/08/14.  She was referred for I-131 treatment because of persistent hyperthyroidism and worsening of the condition and markedly increased levels She was given methimazole subsequently and this was stopped in mid April  RECENT history: Since her I-131 treatment in 3/16 she has felt subjectively fairly good and had normal energy level  In 5/16 her free T4 level was low normal at 0.6 and she was empirically started on 0.025 mg of levothyroxine  She does not feel any different with  thyroxine supplementation and has no unusual fatigue, cold sensitivity or skin changes She does have less thinning of the hair Has gained weight progressively since 5/16  She is taking her levothyroxine in the morning before breakfast   Wt Readings from Last 3 Encounters:  01/24/15 147 lb 3.2 oz (66.769 kg)  09/09/14 141 lb (63.957 kg)  06/19/14 134 lb 12.8 oz (61.145 kg)    Lab  Results  Component Value Date   TSH 0.03* 09/09/2014   TSH 0.03* 06/14/2014   TSH 0.02* 03/06/2014   FREET4 0.90 09/09/2014   FREET4 0.60 06/14/2014   FREET4 1.10 05/10/2014      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 Also apparently had a needle biopsy done and reportedly had hyperplasia in the biopsy. Needle aspiration was also repeated subsequently and she believes results were benign. No records of previous thyroid scans or ultrasounds are available She does occasionally feel a little pressure in her neck but not difficulty swallowing        Medication List       This list is accurate as of: 01/24/15  2:03 PM.  Always use your most recent med list.               L-Lysine 500 MG Caps  Take 500 mg by mouth.     levothyroxine 25 MCG tablet  Commonly known as:  SYNTHROID, LEVOTHROID  TAKE ONE TABLET BY MOUTH ONCE DAILY BEFORE BREAKFAST     Vitamin D3 1000 UNITS Caps  Take 1,000 Units by mouth.            No past medical history on file.  No past surgical history on file.  Family History  Problem Relation Age of Onset  . Hypertension Mother   . Hyperparathyroidism Mother   . Hypertension Brother   . Thyroid disease Neg Hx  Social History:  reports that she has never smoked. She has never used smokeless tobacco. Her alcohol and drug histories are not on file.  Allergies:  Allergies  Allergen Reactions  . Azithromycin     Pt states she just didn't like it    Review of Systems:  No documented history of high blood pressure. she tends to have white coat syndrome        ENDOCRINOLOGY:  no history of Diabetes.            Examination:   BP 146/92 mmHg  Pulse 97  Temp(Src) 98.5 F (36.9 C)  Resp 14  Ht 5\' 8"  (1.727 m)  Wt 147 lb 3.2 oz (66.769 kg)  BMI 22.39 kg/m2  SpO2 99%  Repeat pulse 76 She looks well  The thyroid is enlarged about 2-2.5 times normal bilaterally with  nodular texture and firm  The neck circumference is 33.5cm . Neurological: REFLEXES: at biceps are normal.  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

## 2015-01-25 NOTE — Progress Notes (Signed)
Quick Note:  Please let patient know that the lab result is normal and no change in Rx needed ______

## 2015-04-09 ENCOUNTER — Other Ambulatory Visit: Payer: Self-pay | Admitting: Endocrinology

## 2015-07-25 ENCOUNTER — Ambulatory Visit: Payer: BLUE CROSS/BLUE SHIELD | Admitting: Endocrinology

## 2015-09-15 ENCOUNTER — Other Ambulatory Visit: Payer: Self-pay

## 2015-09-15 MED ORDER — LEVOTHYROXINE SODIUM 25 MCG PO TABS
ORAL_TABLET | ORAL | 0 refills | Status: DC
Start: 2015-09-15 — End: 2016-12-01

## 2015-09-16 ENCOUNTER — Other Ambulatory Visit: Payer: Self-pay

## 2015-10-09 ENCOUNTER — Other Ambulatory Visit: Payer: Self-pay | Admitting: Endocrinology

## 2016-07-18 IMAGING — NM NM RAI THERAPY FOR HYPERTHYROIDISM
1 series · 1 of 1 positions shown · non-contrast
Comparison: none

ADDENDUM:
I discussed radioactive iodine therapy with the patient, including
risks, benefits and alternatives.

I discussed radiation safety principles with the patient.
Written informed consent was obtained.
Timeout protocol was followed.
CLINICAL DATA: Toxic nodular goiter.
EXAM:
RADIOACTIVE IODINE THERAPY FOR HYPERTHYROIDISM
TECHNIQUE: Radioactive iodine prescribed by Dr.Okuno. The risks and benefits of
radioactive iodine therapy were discussed with the patient in detail
by attending radiologist. Alternative therapies were also mentioned.
Radiation safety was discussed with the patient, including how to
protect the general public from exposure. There were no barriers to
communication. Written consent was obtained. The patient then
received a capsule containing the radiopharmaceutical.
The patient will follow-up with the referring physician.
RADIOPHARMACEUTICALS:  30.3 mCi 8-VBV sodium iodide.

[Series 1: bone statics · 2.07mm/px · 1 of 1 slices shown]
[im 1/1]
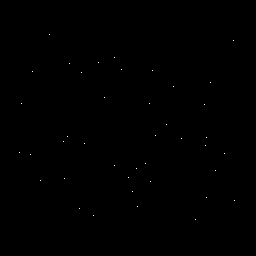

[1 of 1 positions shown; findings below may reference images not displayed]

IMPRESSION: Per oral administration of 8-VBV sodium iodide for the treatment of
hyperthyroidism.

## 2016-11-29 ENCOUNTER — Telehealth: Payer: Self-pay | Admitting: Endocrinology

## 2016-12-01 ENCOUNTER — Encounter: Payer: Self-pay | Admitting: Endocrinology

## 2016-12-01 ENCOUNTER — Ambulatory Visit (INDEPENDENT_AMBULATORY_CARE_PROVIDER_SITE_OTHER): Payer: Medicare HMO | Admitting: Endocrinology

## 2016-12-01 VITALS — BP 144/88 | HR 66 | Ht 68.0 in | Wt 145.8 lb

## 2016-12-01 DIAGNOSIS — E890 Postprocedural hypothyroidism: Secondary | ICD-10-CM

## 2016-12-01 DIAGNOSIS — E042 Nontoxic multinodular goiter: Secondary | ICD-10-CM

## 2016-12-01 DIAGNOSIS — E041 Nontoxic single thyroid nodule: Secondary | ICD-10-CM | POA: Diagnosis not present

## 2016-12-01 NOTE — Progress Notes (Signed)
Jackie Duffy 66 y.o. [id number]   Reason for Appointment:  Follow-up of thyroid    History of Present Illness:     MULTINODULAR goiter:  She was found to have thyroid nodules around 2002 and this was evaluated elsewhere.  Also apparently had a needle biopsy done for one of her thyroid nodules and reportedly had hyperplasia in the biopsy.  Needle aspiration was also repeated subsequently and she believes results were benign.  No records of previous thyroid scans, pathology reports or ultrasounds are available  She has been referred here because of her having a feeling of pressure and slight discomfort in the middle of the lower neck She did have previously some similar sensation when she was being seen in 2016 However her PCP decided to do a thyroid ultrasound and this showed multiple nodules in 10/2016  The most significant any abnormal thyroid nodule was the RIGHT INFERIOR nodule which was 2.2 cm in size, almost completely solid, hypoechoic, tolerated than wide and showing punctate echogenic foci, ACR TI-RADS total points 10  Previous thyroid scans had showed a probable cold nodule on the right lower pole  She does occasionally feel a little pressure in her neck but not difficulty swallowing  Hyperthyroidism: She had routine lab work done in 2012 and was found to have low TSH. She was initially asymptomatic and did not have any overt hyperthyroidism. On her followup visit in 09/2013 she had T3 toxicosis with her TSH being undetectable and mildly increased free T3 levels with normal free T4 level. She reported that she was having some palpitations and shakiness Her scan had shown some areas of increased activity including a focal area of increased activity in the right upper pole. She was given 30 mCi of I-131 on 10/29/13 and again in 04/2014 Her goiter had improved in size with the first I-131 treatment   She became mildly hypothyroid in 06/2014 and was started on 25 g  levothyroxine   RECENT history: She did not come back for follow-up after 01/2015 She has been followed by her PCP, reportedly has had TSH levels done every 6 months and has not no change her 25 g of levothyroxine dosage She does not feel unusually tired and has had no significant weight change  Most recent TSH in 5/18 was 3.6  She is taking her levothyroxine in the morning before breakfast   Wt Readings from Last 3 Encounters:  12/01/16 145 lb 12.8 oz (66.1 kg)  01/24/15 147 lb 3.2 oz (66.8 kg)  09/09/14 141 lb (64 kg)    Lab Results  Component Value Date   TSH 3.37 01/24/2015   TSH 0.03 (L) 09/09/2014   TSH 0.03 (L) 06/14/2014   FREET4 0.71 01/24/2015   FREET4 0.90 09/09/2014   FREET4 0.60 06/14/2014          Allergies as of 12/01/2016      Reactions   Azithromycin    Pt states she just didn't like it      Medication List       Accurate as of 12/01/16  4:35 PM. Always use your most recent med list.          L-Lysine 500 MG Caps Take 500 mg by mouth.   levothyroxine 25 MCG tablet Commonly known as:  SYNTHROID, LEVOTHROID TAKE ONE TABLET BY MOUTH ONCE DAILY BEFORE BREAKFAST   Vitamin D3 1000 units Caps Take 1,000 Units by mouth.           No  past medical history on file.  No past surgical history on file.  Family History  Problem Relation Age of Onset  . Hypertension Mother   . Hyperparathyroidism Mother   . Hypertension Brother   . Thyroid disease Neg Hx     Social History:  reports that she has never smoked. She has never used smokeless tobacco. Her alcohol and drug histories are not on file.  Allergies:  Allergies  Allergen Reactions  . Azithromycin     Pt states she just didn't like it    Review of Systems:  No documented history of high blood pressure. she tends to have white coat syndrome        ENDOCRINOLOGY:  no history of Diabetes.            Examination:   BP (!) 144/88   Pulse 66   Ht 5\' 8"  (1.727 m)   Wt 145 lb  12.8 oz (66.1 kg)   SpO2 97%   BMI 22.17 kg/m     She looks well  Right thyroid lobe shows a smaller superior in a larger inferior nodule with the larger inferior nodule being any firm and measuring about 2-2 0.5 cm Left thyroid is more uniform and firm and about twice normal in size, slightly irregular  No lymphadenopathy in the neck   The neck circumference is 34cm .  Neurological: REFLEXES: at biceps are normal.   Skin  normal No peripheral edema   Assessments    GOITER: She does have a prior multinodular goiter which overall appears to be about the same on exam as on her visit in 2016 However not clear if her right inferior thyroid nodule has change in size, no previous comparison available Also not clear which thyroid nodule she had a biopsy off in the past which was benign This is also cold on her previous nuclear scan  Since her ultrasound shows highly suspicious characteristics this nodule will be appropriate for biopsy She agrees to do this  HYPOTHYROIDISM: She has had mild post ablative hypothyroidism Currently she is taking 25 g of levothyroxine and subjectively she is doing well    Plan:    Will check her thyroid levels today  also and decide on  her levothyroxine doses Needle aspiration biopsy will be ordered  Total evaluation time for evaluation and management, counseling and review of previous and outside records = 25 minutes  Kada Friesen 12/01/2016, 4:35 PM

## 2016-12-02 LAB — T4, FREE: Free T4: 0.78 ng/dL (ref 0.60–1.60)

## 2016-12-02 LAB — TSH: TSH: 4.56 u[IU]/mL — ABNORMAL HIGH (ref 0.35–4.50)

## 2016-12-03 NOTE — Telephone Encounter (Signed)
I have asked them to do the biopsy at [facility] in [place] where she had the ultrasound

## 2016-12-03 NOTE — Telephone Encounter (Signed)
The thyroid biopsy order is in and [facility] is stating an order needs to be placed for thyroid us first they do not see where she has had one in the last year. Please advise

## 2016-12-09 ENCOUNTER — Telehealth: Payer: Self-pay | Admitting: Endocrinology

## 2016-12-09 NOTE — Telephone Encounter (Signed)
Kore[place] from 9/28 has been placed in Dr. Remus BlakeKumar's box at front desk

## 2016-12-09 NOTE — Telephone Encounter (Signed)
I have forwarded this to [employer]orah Dawkins. Thank you.

## 2016-12-09 NOTE — Telephone Encounter (Signed)
Called patient and she stated that she was talking with someone in our office yesterday to schedule her biopsy. She stated that her phone service cut her off from the person she was speaking to and she stated that they were going over instructions for the procedure she needs to have done. She is not sure who she spoke with and just wanted to check to see if there was more information that I am not seeing for the thyroid biopsy procedure needed. Her cell phone is [phone].   Does anyone remember speaking with this patient and if this has already been scheduled?

## 2016-12-09 NOTE — Telephone Encounter (Signed)
Patient was cut off of telephone conversation yesterday re: scheduling her biopsy. Please call patient and if unavailable please leave a message.

## 2016-12-15 ENCOUNTER — Telehealth: Payer: Self-pay | Admitting: *Deleted

## 2016-12-15 NOTE — Telephone Encounter (Signed)
Can you help with this one? Not sure if Jackie Duffy is supposed to call this patient or if someone else is? Please advise.

## 2016-12-15 NOTE — Telephone Encounter (Signed)
Patient called and states she was suppose to be set up for a biopsy. She is inquiring about that appt. Please call patient. See telephone message 12/09/16. Thank you

## 2016-12-22 NOTE — Telephone Encounter (Signed)
Patient need a refill levothyroxine (SYNTHROID, LEVOTHROID) 25 MCG tablet [[id number]][[id number]] send to [id number] - [place], [place] - [address], please advise on how to take it

## 2016-12-22 NOTE — Telephone Encounter (Signed)
Please see message and advise of dosage of levothyroxine.

## 2016-12-22 NOTE — Telephone Encounter (Signed)
You can refill this, she is taking 1-1/2 tablets daily.  Also need to know if she has had a biopsy scheduled and will also need to see her in follow-up in another 6 weeks

## 2016-12-23 ENCOUNTER — Telehealth: Payer: Self-pay | Admitting: Endocrinology

## 2016-12-23 ENCOUNTER — Other Ambulatory Visit: Payer: Self-pay

## 2016-12-23 MED ORDER — LEVOTHYROXINE SODIUM 25 MCG PO TABS: ORAL_TABLET | ORAL | 5 refills | Status: DC

## 2016-12-23 NOTE — Telephone Encounter (Signed)
Called patient and left a voice message to let her know that I have sent over the prescription to the pharmacy for her for levothyroxine and to please call us back to let us know if she has scheduled her biopsy and to schedule a follow up appointment with Dr. Lucianne MussKumar.

## 2016-12-23 NOTE — Telephone Encounter (Signed)
Patient wants Aundra MilletMegan to call her at ph# [phone] She was supposed to have an Order (Needle Biopsy on Thyroid) sent to [facility] but instead the Order was sent to [facility]. [facility] told patient she needs to call the Dr. and that the Dr. needs to send the Order to [facility].

## 2016-12-24 NOTE — Telephone Encounter (Signed)
Patient is asking to have referral sent to [facility], can you please change order. Thanks!

## 2016-12-24 NOTE — Telephone Encounter (Signed)
I had requested the diagnostic Center in [place] on my referral, please send

## 2017-01-04 ENCOUNTER — Telehealth: Payer: Self-pay | Admitting: Endocrinology

## 2017-01-04 NOTE — Telephone Encounter (Signed)
Please see message.  Please advise. 

## 2017-01-04 NOTE — Telephone Encounter (Signed)
That was on my referral message for getting the biopsy, [name] please get this sorted out finally

## 2017-01-04 NOTE — Telephone Encounter (Signed)
Patient state that GI has called her to set up an appt, she stated she wanted to be referred to the [facility]. Please advise

## 2017-01-24 NOTE — Telephone Encounter (Signed)
Please see messages and advise. Does a new order need to be placed? Please advise.

## 2017-01-24 NOTE — Telephone Encounter (Signed)
She wants her biopsy to be done close to her home and not at [facility]..  This is what I had specified on my order previously, she needs to go to the [facility].Jackie Duffy.  Jackie Duffy, please see what's going on

## 2017-01-24 NOTE — Telephone Encounter (Signed)
Please call patient,there is a lot of confusion concerning ultra sound, Jackie Duffy referral coordinator call and there is nothing sh can do about it, she said patient need a new order put in, call GI to see what needs to be put in.  Please advise

## 2017-01-28 ENCOUNTER — Telehealth: Payer: Self-pay | Admitting: Endocrinology

## 2017-01-28 NOTE — Telephone Encounter (Signed)
Patient had ultrasound of thyroid and is waiting to be scheduled for thyroid Biopsy.  Stanton KidneyDebra at [facility] told patient that if she has a biopsy she will have to have another ultrasound first. Please call patient at ph# [phone] to advise/update re: biopsy scheduling.

## 2017-01-28 NOTE — Telephone Encounter (Signed)
As discussed several times before she is getting her biopsy done out of town and we do not need to do anything at [facility].  She had an ultrasound done at the same place also Judeth CornfieldStephanie please find out what the problem is with scheduling

## 2017-01-28 NOTE — Telephone Encounter (Signed)
Patient is wondering why she has to have the ultrasound again?  Please advise.

## 2017-01-28 NOTE — Telephone Encounter (Signed)
[name] has sent a few requests for you to fix something in the order so I have heard and then she can compete the referral with the corrected order to be send out of town

## 2017-02-04 ENCOUNTER — Other Ambulatory Visit: Payer: Self-pay | Admitting: Endocrinology

## 2017-02-04 DIAGNOSIS — E041 Nontoxic single thyroid nodule: Secondary | ICD-10-CM

## 2017-02-04 NOTE — Telephone Encounter (Signed)
New order has been placed. 

## 2017-02-11 ENCOUNTER — Telehealth: Payer: Self-pay | Admitting: Endocrinology

## 2017-02-11 NOTE — Telephone Encounter (Signed)
Thyroid FNA faxed over DR Lucianne Musskumar wants Pt to have biopsy done   biopsy done at [facility]  They are needing  Order faxed over  Please fax to  [phone]

## 2017-02-11 NOTE — Telephone Encounter (Signed)
See message and please order if appropriate.

## 2017-02-11 NOTE — Telephone Encounter (Signed)
Please print already placed orders

## 2017-02-11 NOTE — Telephone Encounter (Signed)
Order signed and faxed to [facility].

## 2017-02-11 NOTE — Telephone Encounter (Signed)
New order has been placed.  [name] is this new order what you need to proceed. Please let me know how I can assist

## 2017-02-21 ENCOUNTER — Other Ambulatory Visit: Payer: Self-pay | Admitting: Endocrinology

## 2017-02-21 ENCOUNTER — Ambulatory Visit
Admission: RE | Admit: 2017-02-21 | Discharge: 2017-02-21 | Disposition: A | Discharge status: Home / Self Care | Payer: Self-pay | Source: Ambulatory Visit | Attending: Endocrinology | Admitting: Endocrinology

## 2017-02-21 DIAGNOSIS — E041 Nontoxic single thyroid nodule: Secondary | ICD-10-CM

## 2017-03-04 NOTE — Telephone Encounter (Signed)
Patient is calling for her the results of her biospy results

## 2017-03-11 NOTE — Telephone Encounter (Signed)
Pt is calling for results of her biospy   Please advise

## 2017-03-14 NOTE — Telephone Encounter (Signed)
Called patient and let her know that we have not received the results from the biopsy and asked her to call them and have them fax to us so I can give to Dr. Lucianne MussKumar to let him review the results.

## 2017-03-14 NOTE — Telephone Encounter (Signed)
Please advise. Thank you

## 2017-03-14 NOTE — Telephone Encounter (Signed)
I have not seen the results, she needs to have the facility fax them to us.  Also needs to set up follow-up appointment

## 2017-03-17 ENCOUNTER — Telehealth: Payer: Self-pay | Admitting: Endocrinology

## 2017-03-17 NOTE — Telephone Encounter (Signed)
Pt is calling about the results from her biopsy/  She states she is returning a call.   Please advise

## 2017-03-17 NOTE — Telephone Encounter (Signed)
Have you received or reviewed her biopsy results yet? Please advise.

## 2017-03-17 NOTE — Telephone Encounter (Signed)
I called the patient on the home number and she stated that she had contacted the facility and gave them the fax number on our business card but I let her know that I have not seen the results yet and gave her the [phone] fax number to have sent to this one and hopefully we will receive it.

## 2017-03-17 NOTE — Telephone Encounter (Signed)
I have not and she was supposed to call the facility to have them fax the results

## 2017-03-21 NOTE — Telephone Encounter (Signed)
Results placed on your desk

## 2017-03-21 NOTE — Telephone Encounter (Signed)
There was not enough material for diagnosis.  [facility] [place] [facility]

## 2017-03-21 NOTE — Telephone Encounter (Signed)
Pt is calling her insurance to see if its covered to have another biopsy in [place]

## 2017-03-22 NOTE — Telephone Encounter (Signed)
Placed on desk 03/21/17

## 2017-03-24 NOTE — Telephone Encounter (Signed)
I see that this was sent to the scan basket.  Please advise on what we need to tell the patient unless you have already talked to her.

## 2017-03-24 NOTE — Telephone Encounter (Signed)
There is another telephone note on this subject pending: Biopsy was indeterminate and [facility] [place] [facility]

## 2017-03-25 NOTE — Telephone Encounter (Signed)
Pt is aware.  

## 2017-03-25 NOTE — Telephone Encounter (Signed)
Can you look into this and contact the patient? I see the previous note but I do not see where anyone has contacted the patient. Thank you!

## 2017-03-31 ENCOUNTER — Telehealth: Payer: Self-pay | Admitting: Endocrinology

## 2017-03-31 NOTE — Telephone Encounter (Signed)
Please advise on the scheduling of the pts biopsy.

## 2017-03-31 NOTE — Telephone Encounter (Signed)
[facility] stated that [facility]'t [facility] her. Please advise

## 2017-04-01 NOTE — Telephone Encounter (Signed)
Per [facility]'s notes in referral, they are currently reviewing with their MD. They will contact the patient once completed.

## 2017-04-27 ENCOUNTER — Ambulatory Visit
Admission: RE | Admit: 2017-04-27 | Discharge: 2017-04-27 | Disposition: A | Discharge status: Home / Self Care | Payer: Medicare HMO | Source: Ambulatory Visit | Attending: Endocrinology | Admitting: Endocrinology

## 2017-04-27 ENCOUNTER — Other Ambulatory Visit (HOSPITAL_COMMUNITY)
Admission: RE | Admit: 2017-04-27 | Discharge: 2017-04-27 | Disposition: A | Discharge status: Home / Self Care | Payer: Medicare HMO | Source: Ambulatory Visit | Attending: Student | Admitting: Student

## 2017-04-27 DIAGNOSIS — E041 Nontoxic single thyroid nodule: Secondary | ICD-10-CM | POA: Diagnosis present

## 2017-05-13 ENCOUNTER — Encounter (HOSPITAL_COMMUNITY): Payer: Self-pay | Admitting: *Deleted

## 2017-06-13 ENCOUNTER — Telehealth: Payer: Self-pay | Admitting: Endocrinology

## 2017-06-13 NOTE — Telephone Encounter (Signed)
Last Office Vist faxed.

## 2017-06-13 NOTE — Telephone Encounter (Signed)
Dr Sharol Given need patients last office notes last  Fax#. [phone]

## 2017-06-16 ENCOUNTER — Telehealth: Payer: Self-pay | Admitting: Endocrinology

## 2017-06-16 NOTE — Telephone Encounter (Signed)
Jackie Duffy has requested patient last office notes, have already sent two requests. P# [phone] F# [phone]

## 2017-06-16 NOTE — Telephone Encounter (Signed)
Faxed to office with fax number listed below.

## 2017-06-21 ENCOUNTER — Ambulatory Visit: Payer: Medicare HMO | Admitting: Endocrinology

## 2017-06-21 ENCOUNTER — Other Ambulatory Visit: Payer: Self-pay | Admitting: Endocrinology

## 2019-05-23 IMAGING — US US FNA BIOPSY THYROID 1ST LESION
1 series · 13 of 14 positions shown · non-contrast
Comparison: US SOFT TISSUE HEAD/NECK 11/05/2016

MEDICATIONS:
2 mL 1% lidocaine

COMPLICATIONS:
None immediate.

INDICATION: Indeterminate thyroid nodule of the right inferior thyroid. Request
is made for fine-needle aspiration of indeterminate thyroid nodule.

EXAM:
ULTRASOUND GUIDED FINE NEEDLE ASPIRATION OF INDETERMINATE THYROID
NODULE
TECHNIQUE: Informed written consent was obtained from the patient after a
discussion of the risks, benefits and alternatives to treatment.
Questions regarding the procedure were encouraged and answered. A
timeout was performed prior to the initiation of the procedure.

[Series 1: us fna biopsy thyroid 1st lesion · 0.05mm/px · 14 acquisitions, 13 frames shown]
[im 1/14]
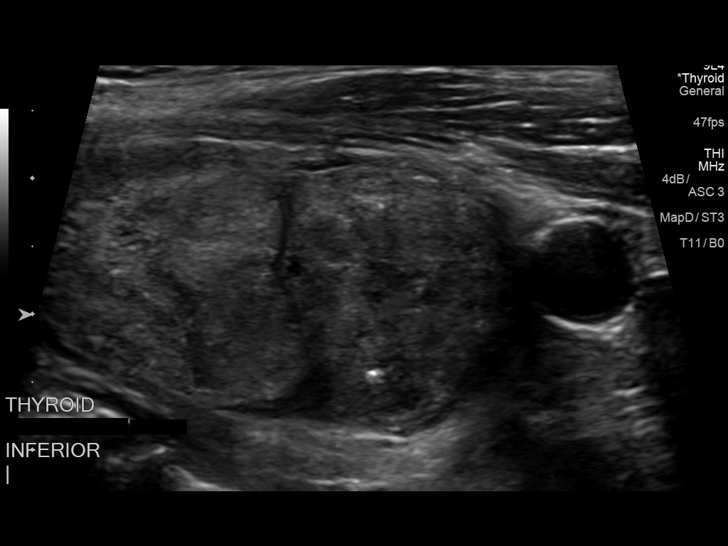
[im 2/14]
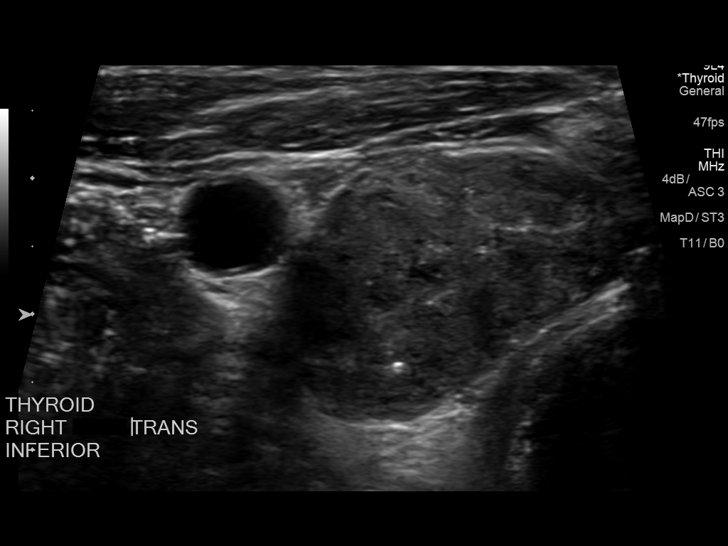
[im 3/14]
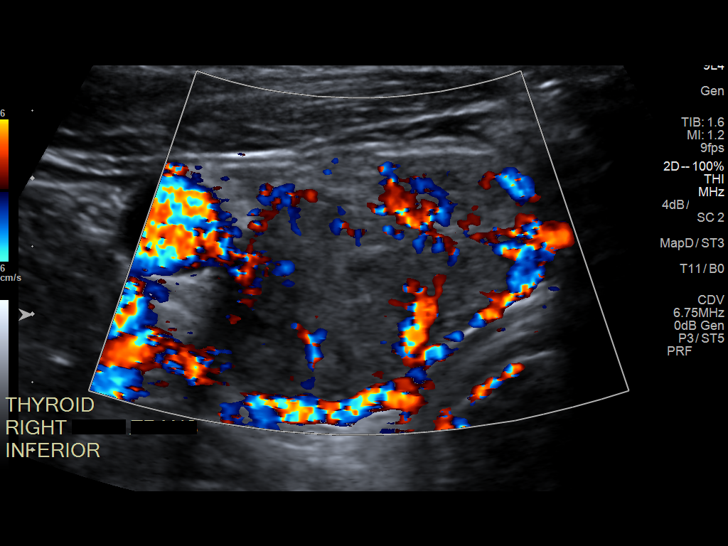
[im 4/14]
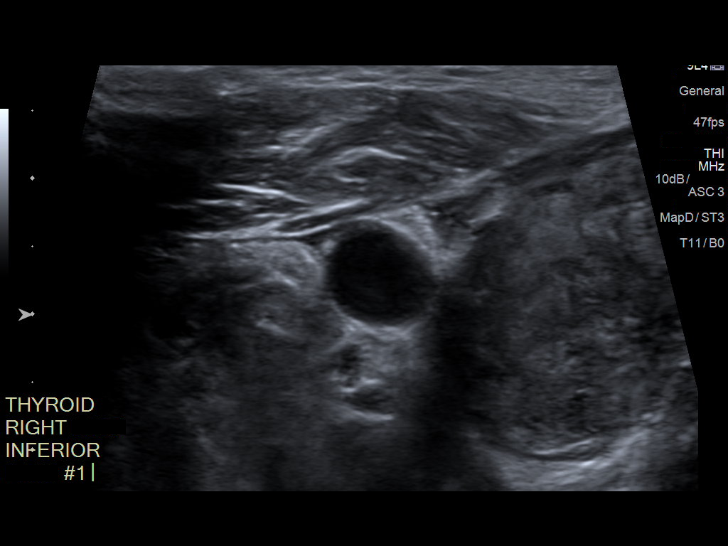
[im 5/14]
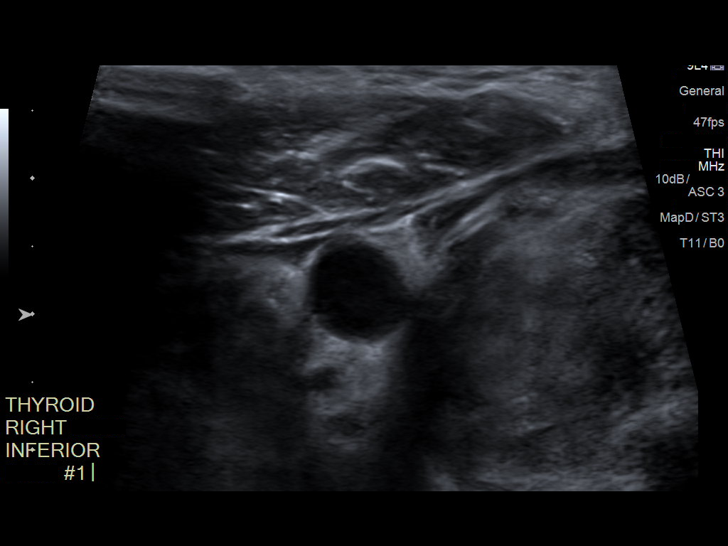
[im 6/14]
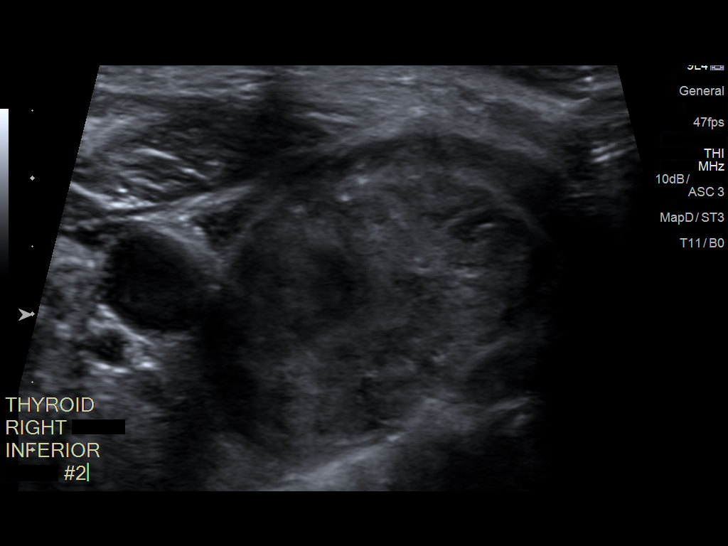
[im 8/14]
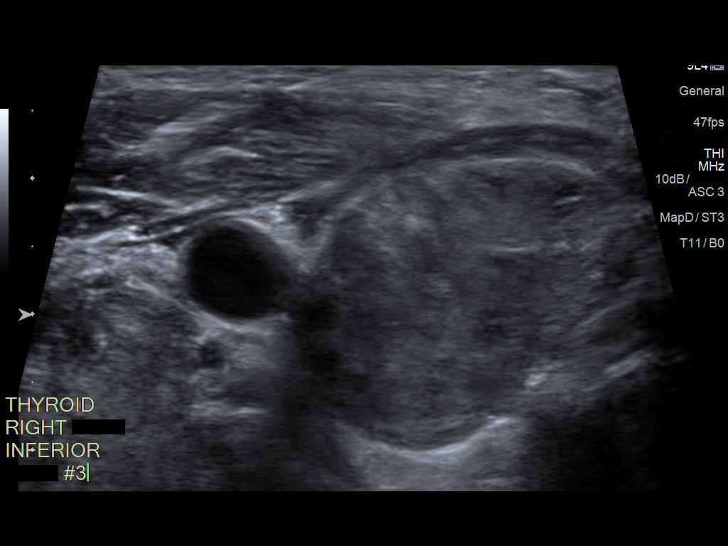
[im 9/14]
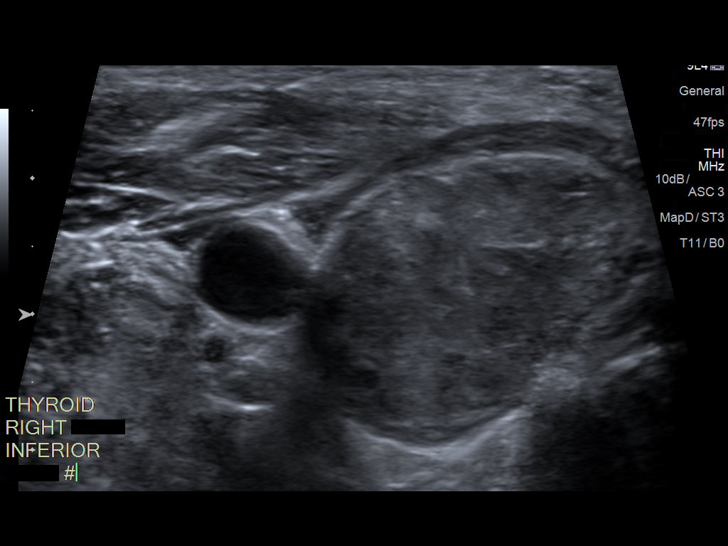
[im 10/14]
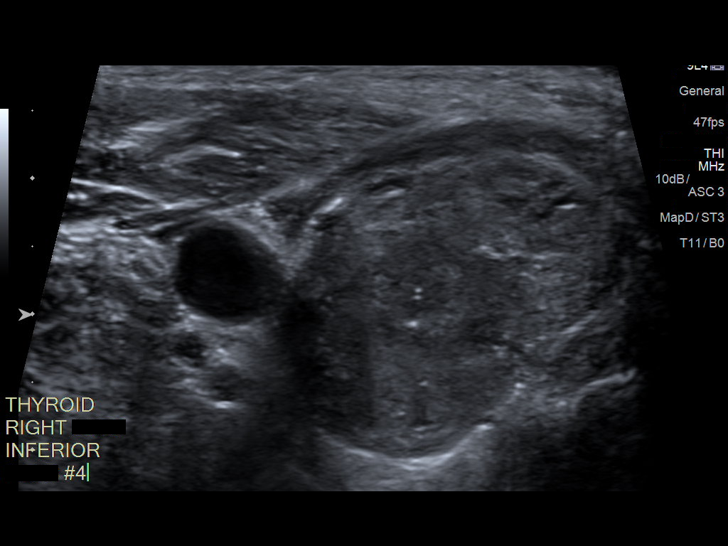
[im 11/14]
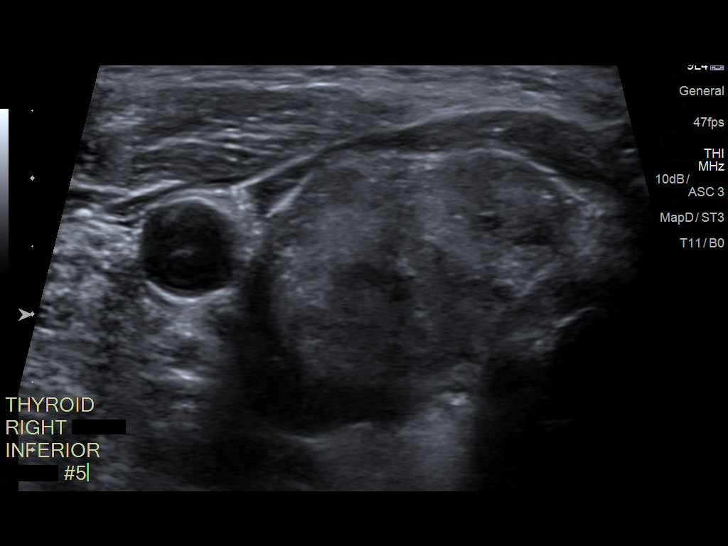
[im 12/14]
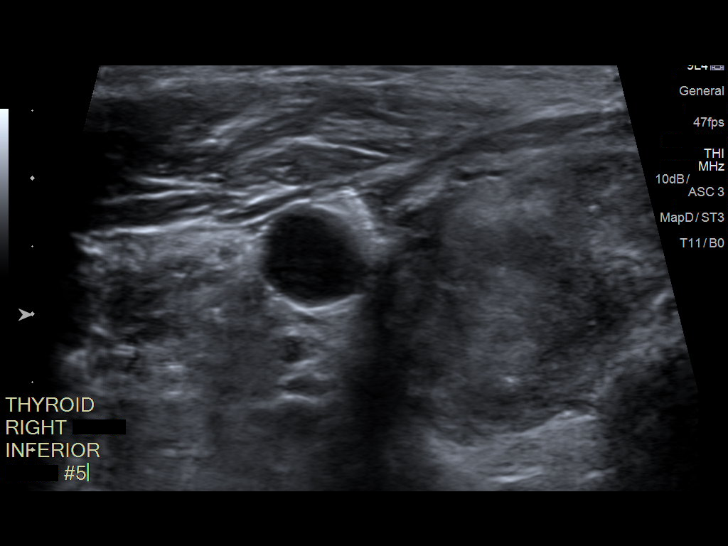
[im 13/14]
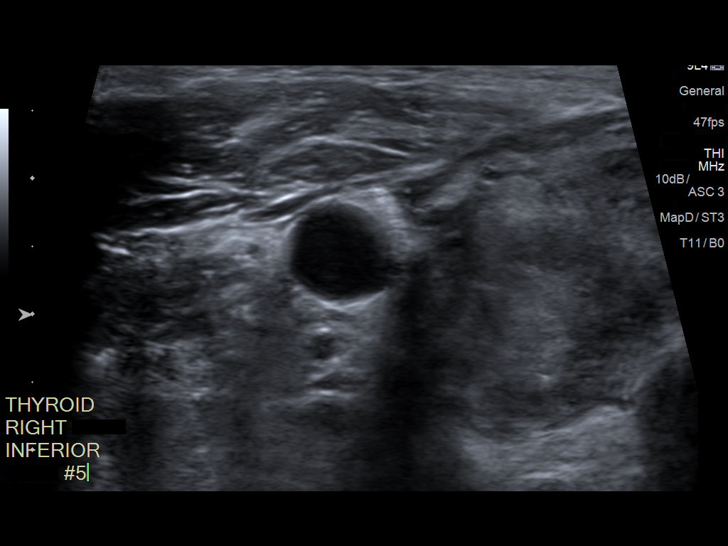
[im 14/14]
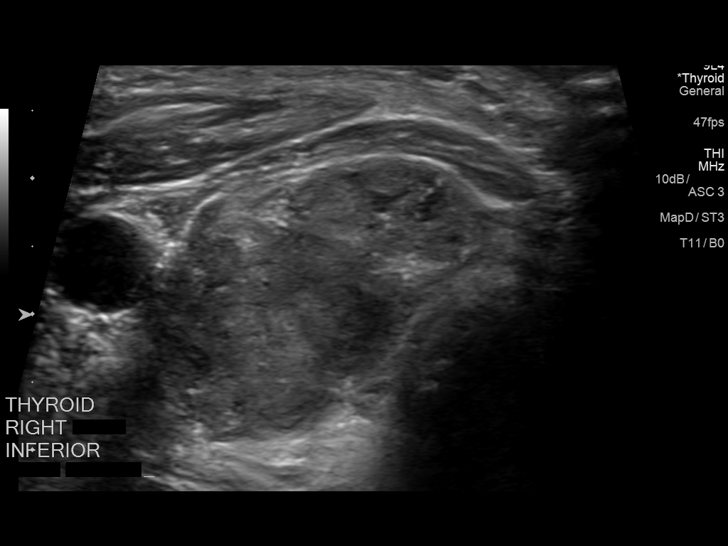

[13 of 14 positions shown; findings below may reference images not displayed]

Pre-procedural ultrasound scanning demonstrated unchanged size and
appearance of the indeterminate nodule within the right inferior
thyroid.

The procedure was planned. The neck was prepped in the usual sterile
fashion, and a sterile drape was applied covering the operative
field. A timeout was performed prior to the initiation of the
procedure. Local anesthesia was provided with 1% lidocaine.

Under direct ultrasound guidance, 5 FNA biopsies were performed of
the right inferior thyroid nodule with a 25 gauge needle. Multiple
ultrasound images were saved for procedural documentation purposes.
The samples were prepared and submitted to pathology. Samples were
also sent for Afirma testing.

Limited post procedural scanning was negative for hematoma or
additional complication. Dressings were placed. The patient
tolerated the above procedures procedure well without immediate
postprocedural complication.
FINDINGS: Nodule reference number based on prior diagnostic ultrasound: 2

Maximum size: 2.2 cm

Location: Right; Inferior

ACR TI-RADS risk category: TR5 (>/= 7 points)

Reason for biopsy: meets ACR TI-RADS criteria

Ultrasound imaging confirms appropriate placement of the needles
within the thyroid nodule.
IMPRESSION: Technically successful ultrasound guided fine needle aspiration of
indeterminate right inferior thyroid nodule.
